# Patient Record
Sex: Male | Born: 1950 | Race: White | Hispanic: No | Smoking: Never smoker
Health system: Southern US, Community
[De-identification: ages and names within clinical notes are randomized; demographics above are authoritative.]

## PROBLEM LIST (undated history)

## (undated) DIAGNOSIS — I10 Essential (primary) hypertension: Secondary | ICD-10-CM

## (undated) DIAGNOSIS — E119 Type 2 diabetes mellitus without complications: Secondary | ICD-10-CM

## (undated) DIAGNOSIS — C61 Malignant neoplasm of prostate: Secondary | ICD-10-CM

## (undated) DIAGNOSIS — N200 Calculus of kidney: Secondary | ICD-10-CM

## (undated) DIAGNOSIS — K222 Esophageal obstruction: Secondary | ICD-10-CM

## (undated) DIAGNOSIS — Z8249 Family history of ischemic heart disease and other diseases of the circulatory system: Secondary | ICD-10-CM

## (undated) HISTORY — PX: PROSTATECTOMY: SHX69

## (undated) HISTORY — DX: Essential (primary) hypertension: I10

## (undated) HISTORY — DX: Calculus of kidney: N20.0

## (undated) HISTORY — DX: Malignant neoplasm of prostate: C61

## (undated) HISTORY — PX: TONSILLECTOMY: SUR1361

## (undated) HISTORY — PX: CATARACT EXTRACTION: SUR2

## (undated) HISTORY — DX: Family history of ischemic heart disease and other diseases of the circulatory system: Z82.49

## (undated) HISTORY — PX: VARICOCELECTOMY: SHX1084

---

## 2002-01-02 ENCOUNTER — Encounter: Payer: Self-pay | Admitting: Urology

## 2002-01-02 ENCOUNTER — Encounter: Admission: RE | Admit: 2002-01-02 | Discharge: 2002-01-02 | Payer: Self-pay | Admitting: Urology

## 2002-01-19 ENCOUNTER — Ambulatory Visit (HOSPITAL_COMMUNITY): Admission: RE | Admit: 2002-01-19 | Discharge: 2002-01-19 | Payer: Self-pay | Admitting: *Deleted

## 2002-02-12 ENCOUNTER — Encounter: Payer: Self-pay | Admitting: Urology

## 2002-02-19 ENCOUNTER — Inpatient Hospital Stay (HOSPITAL_COMMUNITY): Admission: RE | Admit: 2002-02-19 | Discharge: 2002-02-21 | Payer: Self-pay | Admitting: Urology

## 2002-02-19 ENCOUNTER — Encounter (INDEPENDENT_AMBULATORY_CARE_PROVIDER_SITE_OTHER): Payer: Self-pay | Admitting: Specialist

## 2002-10-15 ENCOUNTER — Ambulatory Visit: Admission: RE | Admit: 2002-10-15 | Discharge: 2003-01-06 | Payer: Self-pay | Admitting: Radiation Oncology

## 2003-01-18 ENCOUNTER — Ambulatory Visit: Admission: RE | Admit: 2003-01-18 | Discharge: 2003-01-18 | Payer: Self-pay | Admitting: Radiation Oncology

## 2003-02-11 ENCOUNTER — Ambulatory Visit: Admission: RE | Admit: 2003-02-11 | Discharge: 2003-04-05 | Payer: Self-pay | Admitting: Radiation Oncology

## 2007-07-03 ENCOUNTER — Encounter: Admission: RE | Admit: 2007-07-03 | Discharge: 2007-07-03 | Payer: Self-pay | Admitting: Internal Medicine

## 2007-07-22 ENCOUNTER — Encounter (INDEPENDENT_AMBULATORY_CARE_PROVIDER_SITE_OTHER): Payer: Self-pay | Admitting: *Deleted

## 2007-07-22 ENCOUNTER — Ambulatory Visit (HOSPITAL_COMMUNITY): Admission: RE | Admit: 2007-07-22 | Discharge: 2007-07-22 | Payer: Self-pay | Admitting: *Deleted

## 2010-04-19 ENCOUNTER — Ambulatory Visit (HOSPITAL_COMMUNITY)
Admission: RE | Admit: 2010-04-19 | Discharge: 2010-04-19 | Disposition: A | Discharge status: Home / Self Care | Payer: BC Managed Care – PPO | Source: Ambulatory Visit | Attending: Chiropractic Medicine | Admitting: Chiropractic Medicine

## 2010-04-19 ENCOUNTER — Other Ambulatory Visit (HOSPITAL_COMMUNITY): Payer: Self-pay | Admitting: Chiropractic Medicine

## 2010-04-19 DIAGNOSIS — R52 Pain, unspecified: Secondary | ICD-10-CM

## 2010-04-19 DIAGNOSIS — M47812 Spondylosis without myelopathy or radiculopathy, cervical region: Secondary | ICD-10-CM | POA: Insufficient documentation

## 2010-06-13 NOTE — Op Note (Signed)
NAMECACE, OSORTO NO.:  [id number]   MEDICAL RECORD NO.:  [id number]          PATIENT TYPE:  OUT   LOCATION:  ENDO                         FACILITY:  [facility]   PHYSICIAN:  Georgiana Spinner, M.D.    DATE OF BIRTH:  07-15-1950   DATE OF PROCEDURE:  DATE OF DISCHARGE:                               OPERATIVE REPORT   PROCEDURE:  Colonoscopy.   INDICATIONS:  Rectal bleeding, colon polyps.   ANESTHESIA:  Fentanyl 25 mcg, Versed 2 mg.   PROCEDURE IN DETAIL:  With the patient mildly sedated in the left  lateral decubitus position, the Pentax videoscopic colonoscope was  inserted in the rectum and passed under direct vision to the cecum.  The  cecum was identified by crow's foot of the cecum, ileocecal valve, both  of which were photographed.  From this point the colonoscope was slowly  withdrawn.  Taking circumferential views, the colonic mucosa stopping in  the descending colon, where a polyp was seen, photographed and removed  using snare cautery technique at a setting of 20/150 blended current.  The endoscope was then withdrawn all the way to the rectum, which  appeared normal and showed hemorrhoids in retroflexed view.  The  endoscope was straightened and withdrawn.  The patient's vital signs and  pulse oximeter remained stable.  The patient tolerated the procedure  well. without apparent complications.   FINDINGS:  Polyp of descending colon, internal hemorrhoids, otherwise  unremarkable exam.   PLAN:  Await biopsy report.  The patient will call me for results and  followup with me as needed as an outpatient.           ______________________________  Georgiana Spinner, M.D.     GMO/MEDQ  D:  07/22/2007  T:  07/22/2007  Job:  [id number]

## 2010-06-13 NOTE — Op Note (Signed)
James Booth, SALAHUDDIN NO.:  [id number]   MEDICAL RECORD NO.:  [id number]          PATIENT TYPE:  OUT   LOCATION:  ENDO                         FACILITY:  [facility]   PHYSICIAN:  Georgiana Spinner, M.D.    DATE OF BIRTH:  May 22, 1950   DATE OF PROCEDURE:  07/22/2007  DATE OF DISCHARGE:                               OPERATIVE REPORT   PROCEDURE:  Upper endoscopy.   INDICATIONS:  Abdominal pain.   ANESTHESIA:  1. Fentanyl 100 mcg.  2. Versed 6 mg.   PROCEDURE:  With the patient mildly sedated in the left lateral  decubitus position, the Pentax videoscopic endoscope was inserted in the  mouth and passed under direct vision through the esophagus, which  appeared normal, into the stomach.  Fundus, body, antrum, duodenal bulb,  and second portion of the duodenum all appeared normal.  From this  point, the endoscope was slowly withdrawn, taking circumferential views  of the duodenal mucosa until the endoscope had been pulled back into the  stomach and placed in retroflexion to view the stomach from below.  The  endoscope was straightened and withdrawn, taking circumferential views  of the remaining gastric and esophageal mucosa.  The patient's vital  signs and  pulse oximeter remained stable.  The patient tolerated  procedure well, without apparent complications.   FINDINGS:  Negative exam.   PLAN:  Proceed to colonoscopy.           ______________________________  Georgiana Spinner, M.D.     GMO/MEDQ  D:  07/22/2007  T:  07/22/2007  Job:  [id number]

## 2010-06-16 NOTE — Op Note (Signed)
   NAME:  James Booth, James Booth                           ACCOUNT NO.:  [id number]   MEDICAL RECORD NO.:  [id number]                   PATIENT TYPE:  AMB   LOCATION:  ENDO                                 FACILITY:  [facility]   PHYSICIAN:  Georgiana Spinner, M.D.                 DATE OF BIRTH:  03/26/1950   DATE OF PROCEDURE:  DATE OF DISCHARGE:                                 OPERATIVE REPORT   PROCEDURE:  Colonoscopy.   INDICATIONS FOR PROCEDURE:  Rectal bleeding, colon cancer screening.   ANESTHESIA:  Demerol 10, Versed 2 mg.   DESCRIPTION OF PROCEDURE:  With the patient mildly sedated in the left  lateral decubitus position, the Olympus videoscopic colonoscope was inserted  in the rectum after a normal rectal exam and passed under direct vision to  the cecum. The cecum identified by the ileocecal valve and appendiceal  orifice both of which were photographed. From this point, the colonoscope  was slowly withdrawn taking circumferential views of the entire colonic  mucosa stopping only in the rectum which appeared normal in the rectum and  on retroflexed view. The endoscope was straightened and withdrawn. The  patient's vital signs and pulse oximeter remained stable. The patient  tolerated the procedure well without apparent complications.   FINDINGS:  Unremarkable colonoscopic examination to the cecum.   PLAN:  Have the patient followup with me as needed.                                                Georgiana Spinner, M.D.    GMO/MEDQ  D:  01/19/2002  T:  01/19/2002  Job:  [id number]

## 2010-06-16 NOTE — Op Note (Signed)
NAME:  James Booth, James Booth                           ACCOUNT NO.:  [id number]   MEDICAL RECORD NO.:  [id number]                   PATIENT TYPE:  INP   LOCATION:  X003                                 FACILITY:  [facility]   PHYSICIAN:  Excell Seltzer. Annabell Howells, M.D.                 DATE OF BIRTH:  01-07-1951   DATE OF PROCEDURE:  02/19/2002  DATE OF DISCHARGE:                                 OPERATIVE REPORT   PREOPERATIVE DIAGNOSIS:  Adenocarcinoma of the prostate.   POSTOPERATIVE DIAGNOSIS:  Adenocarcinoma of the prostate.   PROCEDURES:  1. Bilateral pelvic lymphadenectomy.  2. Radical retropubic prostatectomy.   SURGEON:  Excell Seltzer. Annabell Howells, M.D.   ASSISTANT:  Crecencio Mc, M.D.   ANESTHESIA:  General.   ESTIMATED BLOOD LOSS:  750 cubic centimeters.   INTRAVENOUS FLUIDS:  3600 cubic centimeters of crystalloid.   INDICATION:  Mr. James Booth is a 60 year old male, who was recently seen in the  urology clinic due to an elevated PSA of 18.1.  The patient was noted to  have some pyuria on his expressed prostatic secretion and therefore was  treated with doxycycline for prostatitis with plans to repeat his PSA after  treatment.  His subsequent PSA returned elevated at 23.5.  After discussing  options, it was therefore decided to proceed with prostate biopsy.  He  underwent a prostate biopsy which demonstrated a Gleason 3 + 3 = 6  adenocarcinoma of the prostate in approximately 5% of his right-sided  biopsies.  The patient underwent a metastatic evaluation with a bone scan  and CT scan as well due to his high PSA level, and these returned negative  for disease.  After discussing options with the patient, he elected to  proceed with surgical therapy.  Potential risks and benefits of a radical  retropubic prostatectomy were discussed with the patient, and he consented.   DESCRIPTION OF PROCEDURE:  The patient was taken to the operating room, and  a general anesthetic was administered.  The patient was placed in  a supine  position and prepped and draped in the usual sterile fashion.  Preoperative  antibiotics were administered.  Next, a 20 [place] Foley catheter was  inserted into the bladder, and the bladder was drained.  A lower midline  infraumbilical incision was then made from just below the umbilicus down to  the pubic symphysis.  This was carried down through the subcutaneous tissues  with a #10 blade.  Electrocautery was then used to control any bleeding, and  the fascia was identified.  The rectus fascia was then divided, and the  rectus muscles were separated.  The transversalis was sharply entered, and  the space of Retzius was bluntly developed.  Attention then turned to pelvic  lymphadenectomy.  A self-retaining Bookwalter retractor was placed, and the  right-sided lymphadenectomy ensued.  The obturator lymph node package was  carefully removed with sharp  and blunt dissection as necessary.  Care was  taken to hemoclip any lymphatics or blood vessels.  There were no palpable  abnormalities in this lymph node package.  It was then removed and sent off  for permanent pathologic specimen.  Attention then turned to the left side,  and a lymphadenectomy was performed in a similar fashion.  Attention then  turned to the prostatectomy.  The endopelvic fascia on either side of the  prostate was sharply entered, and the levator fibers were bluntly dissected  free from the prostate.  The endopelvic fascia was opened both anteriorly  and posteriorly.  A Hohenfellner was then placed in the space between the  dorsal vein complex and just anterior to the urethra.  A #1 Vicryl was then  passed through the space, and the dorsal vein complex was tied off.  The  venous structures overlying the anterior prostate were then grasped with an  Allis, and a 2-0 Vicryl backbleeding stitch was placed at this point.  Electrocautery was then used to take down the dorsal vein complex.  This was  accomplished without  significant bleeding.  The anterior urethra was then  palpated.  The neurovascular bundle were then displaced laterally and  posteriorly away from the urethra on either side with a right-angle.  The  anterior urethra was then entered sharply with Metzenbaum scissors, and the  Foley catheter was lifted up, cut distally, and brought into the wound.  A  right-angle was passed beneath the posterior urethra, and a piece of  umbilical tape was used to encircle the posterior urethra.  It was then  sharply divided with Metzenbaum scissors as well.  The space between the  anterior rectal fascia and Denonvillier's fascia was then bluntly developed  using digital dissection.  Attention then turned to the lateral prostatic  pedicles which were taken in a piecemeal fashion with right-angle dissection  and hemoclips.  Once the prostatic pedicles were taken down, Denonvillier's  fascia was lightly scored with the electrocautery.  This fascial layer was  then dissected away from the seminal vesicals and the ampulla of the vas  deferens.  Each of these structures were individually dissected free with a  right-angle, hemoclipped, and cut with Metzenbaum scissors.  Attention then  turned to the bladder neck.  The space between the space of the prostate and  bladder neck was identified and carefully dissected out with the aid of a  tonsil.  Once the bladder neck was skeletonized, it was sharply entered with  Metzenbaum scissors.  The Foley balloon was then deflated, and the distal  end of the catheter was removed from the bladder and brought out into the  wound.  The posterior bladder neck was then identified and sharply divided.  This left only a few posterior attachments to the prostate which were  clipped and then divided.  The prostate and seminal vesicals were then  passed off the table for permanent pathologic specimen.  Any bleeding sites  were identified and controlled with hemoclips or electrocautery.   Once sufficient hemostasis was achieved, attention turned to reconstruction of  the bladder neck.  Interrupted 4-0 chromic sutures were used to evert the  bladder neck mucosa.  Then 2-0 Vicryl anastomotic stitches were then placed  into the bladder neck at the 12, 2, 5, 7, and 10 o'clock positions in the  bladder neck and brought out through their corresponding position in the  urethral stump.  A new 20 French Foley catheter was  placed just prior to the  anastomotic stitches being placed.  A Prolene suture was then tied to the  distal Foley catheter and brought out through a separate site in the  anterior bladder.  Foley catheter was then inserted into the bladder, and  the balloon was inflated with 15 cubic centimeters of sterile water.  The  anastomotic stitches were then sequentially tied down, and the Foley  catheter was irrigated.  There appeared to be no anastomotic leak, and the  catheter irrigated without difficulty.  The aforementioned Prolene suture  was then brought out through a separate site in the right-sided anterior  abdominal wall and secured to the skin with the aid of a button.  A #10  Blake drain was also placed into the pelvis and brought out through a  separate stab incision in the left-sided abdominal wall.  This was secured  to the skin with a silk suture.  The fascia was then brought together with a  running #1 Prolene suture.  The wound was then irrigated, and the skin was  reapproximated with staples.  A dressing was placed, and the procedure was  ended.  The patient appeared to tolerate the procedure well and without  complications.  He was able to be transferred to the recovery unit in  satisfactory condition, after being extubated.  Please note that Excell Seltzer.  Annabell Howells, M.D. was the operating surgeon and was present and participated in  the entire procedure.     Crecencio Mc, M.D.                          Excell Seltzer. Annabell Howells, M.D.    LB/MEDQ  D:  02/19/2002  T:   02/19/2002  Job:  [id number]   cc:   Excell Seltzer. Annabell Howells, M.D.  [address]  [place]  [place] [address]  Fax: [phone]   Soyla Murphy. Renne Crigler, M.D.  [address]  [place]  [place] [address]  Fax: [phone]

## 2010-06-16 NOTE — Op Note (Signed)
   NAME:  James Booth, James Booth                           ACCOUNT NO.:  [id number]   MEDICAL RECORD NO.:  [id number]                   PATIENT TYPE:  AMB   LOCATION:  ENDO                                 FACILITY:  [facility]   PHYSICIAN:  Georgiana Spinner, M.D.                 DATE OF BIRTH:  1950/04/30   DATE OF PROCEDURE:  DATE OF DISCHARGE:                                 OPERATIVE REPORT   PROCEDURE:  Upper endoscopy.   INDICATIONS FOR PROCEDURE:  Gastroesophageal reflux disease.   ANESTHESIA:  Demerol 70, Versed 7 mg.   DESCRIPTION OF PROCEDURE:  With the patient mildly sedated in the left  lateral decubitus position, the Olympus videoscopic endoscope was inserted  in the mouth and passed under direct vision through the esophagus which  appeared normal. There was no evidence of Barrett's. We entered into the  stomach. The fundus, body, antrum, duodenal bulb and second portion of the  duodenum all were visualized. From this point, the endoscope was slowly  withdrawn taking circumferential views of the entire duodenal mucosa until  the endoscope was then pulled back in the stomach, placed in retroflexion to  view the stomach from below. The endoscope was then straightened and  withdrawn taking circumferential views of the remaining gastric and  esophageal mucosa. The patient's vital signs and pulse oximeter remained  stable. The patient tolerated the procedure well without apparent  complications.   FINDINGS:  Question of a loose wrap of the GE junction around the endoscope,  otherwise, an unremarkable examination.   PLAN:  Proceed to colonoscopy.                                                Georgiana Spinner, M.D.    GMO/MEDQ  D:  01/19/2002  T:  01/19/2002  Job:  [id number]

## 2010-10-17 ENCOUNTER — Other Ambulatory Visit: Payer: Self-pay | Admitting: Gastroenterology

## 2011-12-10 ENCOUNTER — Other Ambulatory Visit: Payer: Self-pay | Admitting: Dermatology

## 2012-06-09 ENCOUNTER — Other Ambulatory Visit: Payer: Self-pay | Admitting: Dermatology

## 2012-06-11 ENCOUNTER — Ambulatory Visit: Payer: BC Managed Care – PPO | Admitting: Sports Medicine

## 2012-10-16 ENCOUNTER — Encounter: Payer: Self-pay | Admitting: Sports Medicine

## 2012-10-16 ENCOUNTER — Ambulatory Visit (INDEPENDENT_AMBULATORY_CARE_PROVIDER_SITE_OTHER): Payer: BC Managed Care – PPO | Admitting: Sports Medicine

## 2012-10-16 VITALS — BP 178/97 | HR 69 | Ht 69.0 in | Wt 161.0 lb

## 2012-10-16 DIAGNOSIS — M7500 Adhesive capsulitis of unspecified shoulder: Secondary | ICD-10-CM

## 2012-10-16 DIAGNOSIS — M7502 Adhesive capsulitis of left shoulder: Secondary | ICD-10-CM | POA: Insufficient documentation

## 2012-10-16 DIAGNOSIS — M25519 Pain in unspecified shoulder: Secondary | ICD-10-CM

## 2012-10-16 DIAGNOSIS — M25512 Pain in left shoulder: Secondary | ICD-10-CM

## 2012-10-16 MED ORDER — METHYLPREDNISOLONE ACETATE 40 MG/ML IJ SUSP
40.0000 mg | Freq: Once | INTRAMUSCULAR | Status: AC
  Administered 2012-10-16: 40 mg via INTRA_ARTICULAR

## 2012-10-16 MED ORDER — AMITRIPTYLINE HCL 25 MG PO TABS: 25.0000 mg | ORAL_TABLET | Freq: Every day | ORAL | Status: DC

## 2012-10-16 NOTE — Assessment & Plan Note (Signed)
We will try a periodic high volume injection approach  Work dialy on Codman exercises  Reck 1 month to reassess motion and at that visit consider [place]

## 2012-10-16 NOTE — Patient Instructions (Addendum)
We are going to have you work on your range of motion for your shoulder to help prevent you from getting stiff. Follow the exercise sheet and do this 1-2 times a day.   For the next few days, you're going to be sore in the shoulder where you got the injection. Ice it 2-3 times a day for 15-20 minutes. You can take advil or aleve as needed.  You are going to take a muscle relaxer at night called amitriptyline.   We will bring you back in a month and see how you're doing.    Adhesive Capsulitis Sometimes the shoulder becomes stiff and is painful to move. Some people say it feels as if the shoulder is frozen in place. Because of this, the condition is called "frozen shoulder." Its medical name is adhesive capsulitis.  The shoulder joint is made up of strong connective tissue that attaches the ball of the humerus to the shallow shoulder socket. This strong connective tissue is called the joint capsule. This tissue can become stiff and swollen. That is when adhesive capsulitis sets in. CAUSES  It is not always clear just what the cause adhesive capsulitis. Possibilities include:  Injury to the shoulder joint.  Strain. This is a repetitive injury brought about by overuse.  Lack of use. Perhaps your arm or hand was otherwise injured. It might have been in a sling for awhile. Or perhaps you were not using it to avoid pain.  Referred pain. This is a sort of trick the body plays. You feel pain in the shoulder. But, the pain actually comes from an injury somewhere else in the body.  Long-standing health problems. Several diseases can cause adhesive capsulitis. They include diabetes, heart disease, stroke, thyroid problems, rheumatoid arthritis and lung disease.  Being a women older than 40. Anyone can develop adhesive capsulitis but it is most common in women in this age group. SYMPTOMS   Pain.  It occurs when the arm is moved.  Parts of the shoulder might hurt if they are touched.  Pain is  worse at night or when resting.  Soreness. It might not be strong enough to be called pain. But, the shoulder aches.  The shoulder does not move freely.  Muscle spasms.  Trouble sleeping because of shoulder ache or pain. DIAGNOSIS  To decide if you have adhesive capsulitis, your healthcare provider will probably:  Ask about symptoms you have noticed.  Ask about your history of joint pain and anything that might have caused the pain.  Ask about your overall health.  Use hands to feel your shoulder and neck.  Ask you to move your shoulder in specific directions. This may indicate the origin of the pain.  Order imaging tests; pictures of the shoulder. They help pinpoint the source of the problem. An X-ray might be used. For more detail, an MRI is often used. An MRI details the tendons, muscles and ligaments as well as the joint. TREATMENT  Adhesive capsulitis can be treated several ways. Most treatments can be done in a clinic or in your healthcare provider's office. Be sure to discuss the different options with your caregiver. They include:  Physical therapy. You will work on specific exercises to get your shoulder moving again. The exercises usually involve stretching. A physical therapist (a caregiver with special training) can show you what to do and what not to do. The exercises will need to be done daily.  Medication.  Over-the-counter medicines may relieve pain and inflammation (the  body's way of reacting to injury or infection).  Corticosteroids. These are stronger drugs to reduce pain and inflammation. They are given by injection (shots) into the shoulder joint. Frequent treatment is not recommended.  Muscle relaxants. Medication may be prescribed to ease muscle spasms.  Treatment of underlying conditions. This means treating another condition that is causing your shoulder problem. This might be a rotator cuff (tendon) problem  Shoulder manipulation. The shoulder will be  moved by your healthcare provider. You would be under general anesthesia (given a drug that puts you to sleep). You would not feel anything. Sometimes the joint will be injected with salt water (saline) at high pressure to break down internal scarring in the joint capsule.  Surgery. This is rarely needed. It may be suggested in advanced cases after all other treatment has failed. PROGNOSIS  In time, most people recover from adhesive capsulitis. Sometimes, however, the pain goes away but full movement of the shoulder does not return.  HOME CARE INSTRUCTIONS   Take any pain medications recommended by your healthcare provider. Follow the directions carefully.  If you have physical therapy, follow through with the therapist's suggestions. Be sure you understand the exercises you will be doing. You should understand:  How often the exercises should be done.  How many times each exercise should be repeated.  How long they should be done.  What other activities you should do, or not do.  That you should warm up before doing any exercise. Just 5 to 10 minutes will help. Small, gentle movements should get your shoulder ready for more.  Avoid high-demand exercise that involves your shoulder such as throwing. This type of exercise can make pain worse.  Consider using cold packs. Cold may ease swelling and pain. Ask your healthcare provider if a cold pack might help you. If so, get directions on how and when to use them. SEEK MEDICAL CARE IF:   You have any questions about your medications.  Your pain continues to increase. Document Released: 11/12/2008 Document Revised: 04/09/2011 Document Reviewed: 11/12/2008 [facility] Patient Information 2014 [place], [place].

## 2012-10-16 NOTE — Progress Notes (Signed)
  Subjective:    Patient ID: James Booth, male    DOB: 1950-08-15, 62 y.o.   MRN: [id number]  HPI  Patient referred courtesy Dr Renne Crigler  James Booth is a 62 year old male with a history of diet-controlled T2DM who presents with left shoulder pain since June. James Booth states that turning and moving his arm certain ways, especially reaching out to the side and behind his back sends a lightening like sharp pain down his left arm. James Booth feels numb in his arm and has not had much improvement. James Booth does at times have posterior shoulder pain and this is a sharp pain that is a 10/10 at the time it starts, but with rest, James Booth has only a small dull pain. James Booth does feel slightly weak in his arm and feels like James Booth may have lost some fine motor control. James Booth sometimes feels a pulling sensation in his neck if James Booth turns his head to the right.   No specific injury triggered this. No Hx of cervical disk problems No similar problems in RT shoulder  Dm has been controlled by diet so that James Booth has had a HGB A1C < 6  Review of Systems     Objective:   Physical Exam  Constitutional: James Booth is oriented to person, place, and time. James Booth appears well-developed and well-nourished.  HENT:  Head: Normocephalic and atraumatic.  Eyes: Conjunctivae are normal.  Pulmonary/Chest: Effort normal.  Neurological: James Booth is alert and oriented to person, place, and time.  Psychiatric: James Booth has a normal mood and affect. His behavior is normal.    Left shoulder exam:  Normal to inspection, no deformity or scapular winging No tenderness to palpation over scapular spine, at [facility] joint, along clavicle, or at proximal biceps tendon insertion.  Forward flexion to 165 degrees vs 180 RT, abduction/elevation to 110 degrees vs 170 RT,  external rotation at waist level was 50 degrees RT and 25 deg left,   Internal rotation behind back to L4, right side gets to T6. Strength 5/5 with FF, abd, ER/IR  + Neers, + Hawkins, + liftoff all with mild pain but primarily from  position  negative O'Briens, negative empty can, negative speed's, negative Yergasons, negative Sperlings  Negative radial/ulnar tinel sign at elbow     Assessment & Plan:  62 year old male with diet controlled type 2 diabetes who presents with 3 months of shoulder pain and limited ROM, likely 2/2 to adhesive capsulitis  - Inject GH joint - Work on ROM exercises at home - F/U in 1 month to scan shoulder - Amitriptyline 25 mg qHS  Procedure:  Injection of left glenohumeral joint Consent obtained and verified. Time-out conducted. Noted no overlying erythema, induration, or other signs of local infection. Skin prepped in a sterile fashion. Topical analgesic spray: Ethyl chloride. Completed without difficulty. Meds: 7 cc 1% lidocaine with 1 cc depomedrol (40 mg) Pain immediately improved suggesting accurate placement of the medication. Advised to call if fevers/chills, erythema, induration, drainage, or persistent bleeding.

## 2012-11-20 ENCOUNTER — Ambulatory Visit: Payer: BC Managed Care – PPO | Admitting: Sports Medicine

## 2012-11-27 ENCOUNTER — Ambulatory Visit (INDEPENDENT_AMBULATORY_CARE_PROVIDER_SITE_OTHER): Payer: BC Managed Care – PPO | Admitting: Sports Medicine

## 2012-11-27 ENCOUNTER — Encounter: Payer: Self-pay | Admitting: Sports Medicine

## 2012-11-27 VITALS — BP 160/93 | Ht 68.0 in | Wt 165.0 lb

## 2012-11-27 DIAGNOSIS — M7500 Adhesive capsulitis of unspecified shoulder: Secondary | ICD-10-CM

## 2012-11-27 DIAGNOSIS — M7502 Adhesive capsulitis of left shoulder: Secondary | ICD-10-CM

## 2012-11-27 MED ORDER — AMITRIPTYLINE HCL 10 MG PO TABS: 10.0000 mg | ORAL_TABLET | Freq: Every day | ORAL | Status: DC

## 2012-11-27 NOTE — Progress Notes (Signed)
Patient ID: James Booth, male   DOB: 05/10/1950, 62 y.o.   MRN: [id number] 62 year old male presents for followup the left adhesive capsulitis. Was given a corticosteroid injection in his last visit which did not provide much relief. Not any worse however not much better. Has been doing home exercises including pendulum swing, internal and external rotation stretching and stretches overhead. Still unable to compare her to the back reassured him. Does not feel his symptoms have worsened much, however, if not improved.  Review of systems: As per history of present illness otherwise negative.  Examination: BP 160/93  Ht 5\' 8"  (1.727 m)  Wt 165 lb (74.844 kg)  BMI 25.09 kg/m2 Well-developed well-nourished 62 year old white male awake alert and oriented in no acute distress  Left shoulder: Abduction and forward flexion to 170 External rotation to 30 (this is markedly less than the right side) Internal rotation the posterior waist Strength 5 over 5 to flexion abduction Positive Hawkin's, empty can and pushoff test. Neurovascularly intact bilateral upper extremity is with equal pulses  Musculoskeletal ultrasound of the left shoulder was done in the office. Images obtained included transverse and longitudinal views of the biceps tendon, subscapularis tendon, supraspinatus tendon and infraspinatus tendons. In addition, the glenohumeral joint and teres minor muscles were visualized. 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  There was no evidence of rotator cuff tendon tear.

## 2012-11-27 NOTE — Patient Instructions (Signed)
Continue to do the range of motion exercises at home.  Take the elavil at night.  Follow up in 6 weeks time.

## 2012-11-27 NOTE — Assessment & Plan Note (Signed)
No significant worsening in range of motion however, not much improvement. Patient started on 10 mg of amitriptyline each bedtime Will continue pendulum swings and other home stretching program. Followup in 6 weeks' time.

## 2012-12-04 ENCOUNTER — Other Ambulatory Visit: Payer: Self-pay

## 2013-01-08 ENCOUNTER — Encounter: Payer: Self-pay | Admitting: Sports Medicine

## 2013-01-08 ENCOUNTER — Ambulatory Visit (INDEPENDENT_AMBULATORY_CARE_PROVIDER_SITE_OTHER): Payer: BC Managed Care – PPO | Admitting: Sports Medicine

## 2013-01-08 VITALS — BP 158/101 | HR 78 | Ht 68.0 in | Wt 165.0 lb

## 2013-01-08 DIAGNOSIS — M7502 Adhesive capsulitis of left shoulder: Secondary | ICD-10-CM

## 2013-01-08 DIAGNOSIS — M7500 Adhesive capsulitis of unspecified shoulder: Secondary | ICD-10-CM

## 2013-01-08 DIAGNOSIS — E1110 Type 2 diabetes mellitus with ketoacidosis without coma: Secondary | ICD-10-CM

## 2013-01-08 MED ORDER — AMITRIPTYLINE HCL 25 MG PO TABS: 25.0000 mg | ORAL_TABLET | Freq: Every day | ORAL | Status: DC

## 2013-01-08 NOTE — Assessment & Plan Note (Signed)
He is moving from painful phase to frozen phase of adhesive capsulitis  Much less pain but still stiffness is similar  What I do note today is some progress in ROM particularly with elevation and abduction  Offered formal PT but since in a busy time at work wants to keep working on HEP  Increase amitriptyline to 25 at HS Add Vit C 500 and Vit D 800 IU - both have some limited EBM for benefit in this condition  Reck 3 mos/ reassured that he is on track of typical case

## 2013-01-08 NOTE — Patient Instructions (Signed)
Please increase amitriptyline to 25 mg at bedtime  Start Vitamin C 500 mg, Vitamin D 800 IU per day   Add doorway stretch to exercise regimen  When doing exercises ok to push to mild pain, but not more than that  Please follow up in 3 months  Thank you for seeing [place] today!

## 2013-01-08 NOTE — Progress Notes (Signed)
   Subjective:    Patient ID: James Booth, male    DOB: 1950/02/02, 62 y.o.   MRN: [id number]  HPI  Pt presents to clinic for f/u of lt adhesive capsulitis which he reports is the same.  We first saw him in Sept Has aching in Lt shoulder at rest some days, certain motions - overhead, elevation and abduction, and reaching backward cause pain. Compliant with home exercises. Taking 10 mg of amitriptyline at bedtime with no side effects.   Note [facility] only gave him a couple of weeks of relief on visit 1  What he does note is that he is able to do more activity and does not get pain that he did note before He no longer awakes with night pain  Tightness and exercises are still hard  Soc Hx:  In middle of merger at [employer] and hard to get free to do formal PT   Review of Systems     Objective:   Physical Exam Pleasant, NAD  Lt shoulder exam:  180 deg flexion  85 deg abduction 170 palm up abduction and elevation ER 30 deg Positive frozen shoulder test with 10 deg motion with assist Back scratch to belt line   Strength is good  He is able to demonstrate all exercises        Assessment & Plan:

## 2013-01-08 NOTE — Assessment & Plan Note (Signed)
He is to see Dr Renne Crigler soon  If DM control an issue he will work on that but he thinks BS have been OK

## 2013-01-25 ENCOUNTER — Other Ambulatory Visit: Payer: Self-pay | Admitting: Emergency Medicine

## 2013-02-26 ENCOUNTER — Ambulatory Visit: Payer: BC Managed Care – PPO | Admitting: Sports Medicine

## 2013-07-24 ENCOUNTER — Ambulatory Visit (INDEPENDENT_AMBULATORY_CARE_PROVIDER_SITE_OTHER): Payer: BC Managed Care – PPO | Admitting: Cardiovascular Disease

## 2013-07-24 ENCOUNTER — Encounter: Payer: Self-pay | Admitting: Cardiovascular Disease

## 2013-07-24 VITALS — BP 184/86 | HR 88 | Ht 69.0 in | Wt 178.4 lb

## 2013-07-24 DIAGNOSIS — I10 Essential (primary) hypertension: Secondary | ICD-10-CM

## 2013-07-24 DIAGNOSIS — Z79899 Other long term (current) drug therapy: Secondary | ICD-10-CM

## 2013-07-24 MED ORDER — LISINOPRIL 5 MG PO TABS: 5.0000 mg | ORAL_TABLET | Freq: Every day | ORAL | Status: DC

## 2013-07-24 NOTE — Progress Notes (Signed)
07/24/2013 NAWAF STRANGE   [birth year]  [id number]  Primary Physician Horatio Pel, MD Primary Cardiologist: Lorretta Harp MD Renae Gloss   HPI:  Mr. Tuite is a delightful 63 year old divorced Caucasian male father of 62, grandfather for grandchildren lives with his long-time girlfriend. He works in Engineer, technical sales at [employer]  I saw him 5 years ago because of chest pain. His risk factors include family history with a mother who died at age 102 of myocardial infarction but otherwise is negative other than recently diagnosed type 2 diabetes and hypertension. He was never had a heart attack or stroke. At that time addendum a Myoview stress test was normal and his pain resolved. He does have a remote history of prostate cancer status post prostatectomy in 2004 by Dr. Irine Seal. He saw Dr. Deland Pretty recently for primary care who noted some PVCs and referred him back for further evaluation. He denies chest pain or shortness of breath.   Current Outpatient Prescriptions  Medication Sig Dispense Refill  . DYMISTA 137-50 MCG/ACT SUSP Place 1 spray into the nose as needed.      Marland Kitchen lisinopril (PRINIVIL,ZESTRIL) 5 MG tablet Take 1 tablet (5 mg total) by mouth daily.  90 tablet  3  . pantoprazole (PROTONIX) 40 MG tablet Take 1 tablet by mouth daily.       No current facility-administered medications for this visit.    Allergies  Allergen Reactions  . Aspirin Nausea Only    Acid reflux    History   Social History  . Marital Status: Divorced    Spouse Name: N/A    Number of Children: N/A  . Years of Education: N/A   Occupational History  . Not on file.   Social History Main Topics  . Smoking status: Never Smoker   . Smokeless tobacco: Never Used  . Alcohol Use: Not on file  . Drug Use: Not on file  . Sexual Activity: Not on file   Other Topics Concern  . Not on file   Social History Narrative  . No narrative on file     Review of Systems: General: negative for  chills, fever, night sweats or weight changes.  Cardiovascular: negative for chest pain, dyspnea on exertion, edema, orthopnea, palpitations, paroxysmal nocturnal dyspnea or shortness of breath Dermatological: negative for rash Respiratory: negative for cough or wheezing Urologic: negative for hematuria Abdominal: negative for nausea, vomiting, diarrhea, bright red blood per rectum, melena, or hematemesis Neurologic: negative for visual changes, syncope, or dizziness All other systems reviewed and are otherwise negative except as noted above.    Blood pressure 184/86, pulse 88, height 5\' 9"  (1.753 m), weight 178 lb 6.4 oz (80.922 kg).  General appearance: alert and no distress Neck: no adenopathy, no carotid bruit, no JVD, supple, symmetrical, trachea midline and thyroid not enlarged, symmetric, no tenderness/mass/nodules Lungs: clear to auscultation bilaterally Heart: regular rate and rhythm, S1, S2 normal, no murmur, click, rub or gallop Extremities: extremities normal, atraumatic, no cyanosis or edema  EKG normal sinus rhythm at 88 without ST or T wave changes  ASSESSMENT AND PLAN:   Essential hypertension His blood pressure has been consistently elevated recently. Also was recently diagnosed with type 2 diabetes. I'm going to start him on low-dose ACE inhibitor, have him keep a blood pressure log for the next month until basic metabolic panel in 2 weeks. He will see Erasmo Downer back to review this data and to determine if any adjustments need to be  made. I will see him back when necessary      Lorretta Harp MD [facility], [facility] 07/24/2013 4:56 PM

## 2013-07-24 NOTE — Assessment & Plan Note (Signed)
His blood pressure has been consistently elevated recently. Also was recently diagnosed with type 2 diabetes. I'm going to start him on low-dose ACE inhibitor, have him keep a blood pressure log for the next month until basic metabolic panel in 2 weeks. He will see Erasmo Downer back to review this data and to determine if any adjustments need to be made. I will see him back when necessary

## 2013-07-24 NOTE — Patient Instructions (Signed)
  We will see you back in follow up in 1 month with [facility] for blood pressure management.   Dr Gwenlyn Found has ordered : 1. Start Lisinopril 5mg  daily  2. Have blood work done in 3 weeks.

## 2013-08-25 LAB — BASIC METABOLIC PANEL
BUN: 15 mg/dL (ref 6–23)
CHLORIDE: 104 meq/L (ref 96–112)
CO2: 28 meq/L (ref 19–32)
CREATININE: 0.79 mg/dL (ref 0.50–1.35)
Calcium: 9.2 mg/dL (ref 8.4–10.5)
GLUCOSE: 165 mg/dL — AB (ref 70–99)
Potassium: 4.4 mEq/L (ref 3.5–5.3)
Sodium: 142 mEq/L (ref 135–145)

## 2013-08-26 ENCOUNTER — Encounter: Payer: Self-pay | Admitting: Pharmacist Clinician (PhC)/ Clinical Pharmacy Specialist

## 2013-08-26 ENCOUNTER — Ambulatory Visit (INDEPENDENT_AMBULATORY_CARE_PROVIDER_SITE_OTHER): Payer: BC Managed Care – PPO | Admitting: Pharmacist Clinician (PhC)/ Clinical Pharmacy Specialist

## 2013-08-26 VITALS — BP 156/92 | HR 80 | Ht 69.0 in | Wt 177.0 lb

## 2013-08-26 DIAGNOSIS — I10 Essential (primary) hypertension: Secondary | ICD-10-CM

## 2013-08-26 NOTE — Progress Notes (Signed)
     08/26/2013 James Booth March 18, 1950 [id number]   HPI:  James Booth is a 63 y.o. male patient of Dr Gwenlyn Found, with a PMH below who presents today for a blood pressure check.  Other than his mother, who died at age 75 of myocardial infarction, he has no significant CV risk factors.  In the past year he has been diagnosed with both DM and hypertension.  He is taking only lisinopril 5mg  daily, as they are trying to manage the DM with diet and exercise at this time.   His last A1c was 6.1 and most fasting home BS readings are 100-120.  He states he has previously had problems with his BP going "up and down" and was told there was probably some white coat hypertension.  He wore a 24hr BP monitor several years ago, but states that the average readings was still WNL.  He complains of occasional "fogginess" in his head, but otherwise no problems since starting the lisinopril one month ago.  He does not use tobacco, averages 1 alcoholic drink per day, and drinks 3-4 cups of coffee per day.  Occasionally will get a coffee at [facility] or similar coffee shop.  He still works, has a Network engineer job, but tries to walk 15 minutes twice daily and does some yard work.   He has used salt at the table, but is trying to cut back and uses it only on certain foods.  He brings in his home readings today, which show a marked improvement once he started the lisinopril.  His highest reading since then has been 140/90, with averags in the 086P systolic and 61P diastolic.  He also has his cuff today for comparison, an [employer] wrist model that is fairly new.   Current Outpatient Prescriptions  Medication Sig Dispense Refill  . DYMISTA 137-50 MCG/ACT SUSP Place 1 spray into the nose as needed.      Marland Kitchen lisinopril (PRINIVIL,ZESTRIL) 5 MG tablet Take 1 tablet (5 mg total) by mouth daily.  90 tablet  3  . pantoprazole (PROTONIX) 40 MG tablet Take 1 tablet by mouth daily as needed.        No current facility-administered medications for this  visit.    Allergies  Allergen Reactions  . Aspirin Nausea Only    Acid reflux    Past Medical History  Diagnosis Date  . Hypertension   . Family history of heart disease     Blood pressure 156/92, pulse 80, height 5\' 9"  (1.753 m), weight 177 lb (80.287 kg).Standing 160/94, R arm 150/80   ASSESSMENT AND PLAN:  While his BP is elevated in the office today, his home cuff gave a very similar reading at 156/102.  I suspect that since his home readings are much better, and his technique for using the cuff is good, that he truly does have some white coat hypertension.  He is working on diet/exercise as part of his DM treatment, so for now I am going to leave his lisinopril alone at 5mg  daily.  He is to continue home BP monitoring and call if he sees readings trend to greater than 140/80 (ADA goal).  I will see him again if his BP increases.    Tommy Medal PharmD CPP [facility]

## 2013-08-26 NOTE — Patient Instructions (Signed)
Your blood pressure today is 160/94  (goal <140/80)  Check your blood pressure at home several times per week and keep record of the readings.  Take your BP meds as follows: continue with lisinopril 5mg  daily  Exercise as you're able, try to walk approximately 30 minutes per day.  Keep salt intake to a minimum, especially watch canned and prepared boxed foods.  Eat more fresh fruits and vegetables and fewer canned items.  Avoid eating in fast food restaurants.    HOW TO TAKE YOUR BLOOD PRESSURE:   Rest 5 minutes before taking your blood pressure.    Don't smoke or drink caffeinated beverages for at least 30 minutes before.   Take your blood pressure before (not after) you eat.   Sit comfortably with your back supported and both feet on the floor (don't cross your legs).   Elevate your arm to heart level on a table or a desk.   Use the proper sized cuff. It should fit smoothly and snugly around your bare upper arm. There should be enough room to slip a fingertip under the cuff. The bottom edge of the cuff should be 1 inch above the crease of the elbow.   Ideally, take 3 measurements at one sitting and record the average.

## 2013-09-17 ENCOUNTER — Ambulatory Visit (INDEPENDENT_AMBULATORY_CARE_PROVIDER_SITE_OTHER): Payer: BC Managed Care – PPO | Admitting: Neurology

## 2013-09-17 ENCOUNTER — Encounter: Payer: Self-pay | Admitting: Neurology

## 2013-09-17 VITALS — BP 154/90 | HR 92 | Ht 69.75 in | Wt 176.0 lb

## 2013-09-17 DIAGNOSIS — R51 Headache: Secondary | ICD-10-CM

## 2013-09-17 NOTE — Progress Notes (Signed)
[facility]    Provider:  Dr Janann Colonel Referring Provider: Horatio Pel,* Primary Care Physician:  Horatio Pel, MD  CC:  headache  HPI:  James Booth is a 63 y.o. male here as a referral from Dr. Shelia Media for new onset headache and dizziness.  Notes symptoms started around 1 month ago. Described as a severe pressure type pain, note a congested sensation in his ears and nasal region. Chronic and constant, will fluctuate in severity. No nausea or emesis. No photo or phonophobia. No change in vision with the vision. No focal motor or sensory changes. Notes some light headed sensation with the headache. Severity varies throughout the day. No change with change in position. Headache is predominantly located in bifrontal and maxillary regions, feels like an internal pressure. Notes some difficulty breathing, nasal congestion. Takes a nasal decongestant which improves breathing but does not resolve the pressure. Has history of seasonal allergies, lately it has become more constant. Currently taking 1 to 2 advil a day.   Has type 2 DM, controlled with diet. Recently diagnosed with HTN, taking losartan, notes his SBP fluctuates from 120s to 150s when checking at home. Otherwise healthy. Has history of prostate CA in 2004, treated with RT and radical prostectomy.   Review of Systems: Out of a complete 14 system review, the patient complains of only the following symptoms, and all other reviewed systems are negative. + headache, snoring, ringing ears  History   Social History  . Marital Status: Divorced    Spouse Name: N/A    Number of Children: 3  . Years of Education: BS    Occupational History  . Not on file.   Social History Main Topics  . Smoking status: Never Smoker   . Smokeless tobacco: Never Used  . Alcohol Use: Yes     Comment: Daily 2-3 Rum and coke   . Drug Use: No  . Sexual Activity: Not on file   Other Topics Concern  . Not on file    Social History Narrative   Patient has 2 sons and 1 daughter.    Patient is living with girlfriend. [name]    Patient is divorced.    Patient has a B.S    Patient is right handed.    Patient is working at [employer].             History reviewed. No pertinent family history.  Past Medical History  Diagnosis Date  . Hypertension   . Family history of heart disease   . Nephrolithiasis   . Prostate cancer     Past Surgical History  Procedure Laterality Date  . Prostatectomy    . Tonsillectomy    . Varicocelectomy      Current Outpatient Prescriptions  Medication Sig Dispense Refill  . DYMISTA 137-50 MCG/ACT SUSP Place 1 spray into the nose as needed.      Marland Kitchen losartan (COZAAR) 25 MG tablet       . ONE TOUCH ULTRA TEST test strip       . pantoprazole (PROTONIX) 40 MG tablet Take 1 tablet by mouth daily as needed.        No current facility-administered medications for this visit.    Allergies as of 09/17/2013 - Review Complete 09/17/2013  Allergen Reaction Noted  . Aspirin Nausea Only 10/16/2012    Vitals: BP 154/90  Pulse 92  Ht 5' 9.75" (1.772 m)  Wt 176 lb (79.833 kg)  BMI 25.42 kg/m2 Last Weight:  Wt Readings from Last 1 Encounters:  09/17/13 176 lb (79.833 kg)   Last Height:   Ht Readings from Last 1 Encounters:  09/17/13 5' 9.75" (1.772 m)     Physical exam: Exam: Gen: NAD, conversant Eyes: anicteric sclerae, moist conjunctivae HENT: Atraumatic, oropharynx clear Neck: Trachea midline; supple,  Lungs: CTA, no wheezing, rales, rhonic                          CV: RRR, no MRG Abdomen: Soft, non-tender;  Extremities: No peripheral edema  Skin: Normal temperature, no rash,  Psych: Appropriate affect, pleasant  Neuro: MS: AA&Ox3, appropriately interactive, normal affect   Attention: WORLD backwards  Speech: fluent w/o paraphasic error  Memory: good recent and remote recall  CN: PERRL, VFF to FC bilat, unable to fully visualize fundus due to  pupil size, EOMI no nystagmus, no ptosis, sensation intact to LT V1-V3 bilat, face symmetric, no weakness, hearing grossly intact, palate elevates symmetrically, shoulder shrug 5/5 bilat,  tongue protrudes midline, no fasiculations noted.  Motor: normal bulk and tone Strength: 5/5  In all extremities  Coord: rapid alternating and point-to-point (FNF, HTS) movements intact.  Reflexes: symmetrical, bilat downgoing toes  Sens: LT intact in all extremities  Gait: posture, stance, stride and arm-swing normal. Tandem gait intact. Able to walk on heels and toes. Romberg absent.   Assessment:  After physical and neurologic examination, review of laboratory studies, imaging, neurophysiology testing and pre-existing records, assessment will be reviewed on the problem list.  Plan:  Treatment plan and additional workup will be reviewed under Problem List.  1)Headache 2)HTN  63y/o gentleman presenting for initial evaluation of chronic headache. Physical exam is overall unremarkable. Based on clinical description this is most consistent with a diagnosis of a sinus headache. Differential would also include tension type vs BP related vs cervicogenic headache. History of snoring also raises question of sleep apnea. Based on age of onset and history of prostate CA would need to consider a central structural process though this is less likely based on history and normal exam. Will check head CT. Pending results would consider referral to ENT and/or sleep study. Will continue ibuprofen for symptomatic relief. Hold off on further treatment pending CT results.    Jim Like, DO  [facility] [address] [place], [place] [address]  Phone [phone] Fax [phone]

## 2013-09-17 NOTE — Patient Instructions (Signed)
Overall you are doing fairly well but I do want to suggest a few things today:   Remember to drink plenty of fluid, eat healthy meals and do not skip any meals. Try to eat protein with a every meal and eat a healthy snack such as fruit or nuts in between meals. Try to keep a regular sleep-wake schedule and try to exercise daily, particularly in the form of walking, 20-30 minutes a day, if you can.   As far as diagnostic testing:  1)I would like you to have a CT of the head completed. Please call [facility] to schedule this.   I will follow up with you once the CT is completed. Please call us with any interim questions, concerns, problems, updates or refill requests.   My clinical assistant and will answer any of your questions and relay your messages to me and also relay most of my messages to you.   Our phone number is [phone]. We also have an after hours call service for urgent matters and there is a physician on-call for urgent questions. For any emergencies you know to call 911 or go to the nearest emergency room

## 2013-09-24 ENCOUNTER — Ambulatory Visit
Admission: RE | Admit: 2013-09-24 | Discharge: 2013-09-24 | Disposition: A | Discharge status: Home / Self Care | Payer: BC Managed Care – PPO | Source: Ambulatory Visit | Attending: Neurology | Admitting: Neurology

## 2013-09-24 DIAGNOSIS — R51 Headache: Secondary | ICD-10-CM

## 2013-09-25 ENCOUNTER — Telehealth: Payer: Self-pay | Admitting: Neurology

## 2013-09-25 NOTE — Telephone Encounter (Signed)
Please let him know it is not read yet. I will contact him as soon as I have the results. Thanks.

## 2013-09-25 NOTE — Telephone Encounter (Signed)
Patient calling wanting CT results that were done on 09-24-13. I do not see the report in the system. Please advise.

## 2013-09-25 NOTE — Telephone Encounter (Signed)
Patient requesting CT scan results.  Please call anytime and may leave detailed message if not available.

## 2013-09-28 ENCOUNTER — Other Ambulatory Visit: Payer: Self-pay | Admitting: Neurology

## 2013-09-28 DIAGNOSIS — R51 Headache: Secondary | ICD-10-CM

## 2013-09-28 NOTE — Telephone Encounter (Signed)
Referral sent by [facility]

## 2013-09-28 NOTE — Telephone Encounter (Signed)
Called patient and he is aware of his results. Patient would like to know what his next step is now since he is still having problems.

## 2013-09-28 NOTE — Telephone Encounter (Signed)
Returned call. No answer. Left message informing him that I would like to refer him to ENT and that a referral would be placed.

## 2013-10-26 ENCOUNTER — Other Ambulatory Visit: Payer: Self-pay | Admitting: *Deleted

## 2014-02-17 ENCOUNTER — Other Ambulatory Visit: Payer: Self-pay | Admitting: Otolaryngology

## 2014-02-17 DIAGNOSIS — D497 Neoplasm of unspecified behavior of endocrine glands and other parts of nervous system: Secondary | ICD-10-CM

## 2014-02-17 DIAGNOSIS — H9201 Otalgia, right ear: Secondary | ICD-10-CM

## 2014-02-18 ENCOUNTER — Ambulatory Visit
Admission: RE | Admit: 2014-02-18 | Discharge: 2014-02-18 | Disposition: A | Discharge status: Home / Self Care | Payer: 59 | Source: Ambulatory Visit | Attending: Otolaryngology | Admitting: Otolaryngology

## 2014-02-18 DIAGNOSIS — H9201 Otalgia, right ear: Secondary | ICD-10-CM

## 2014-02-18 DIAGNOSIS — D497 Neoplasm of unspecified behavior of endocrine glands and other parts of nervous system: Secondary | ICD-10-CM

## 2014-08-17 ENCOUNTER — Encounter: Payer: Self-pay | Admitting: Cardiovascular Disease

## 2015-01-04 DIAGNOSIS — E119 Type 2 diabetes mellitus without complications: Secondary | ICD-10-CM | POA: Diagnosis not present

## 2015-01-06 DIAGNOSIS — E119 Type 2 diabetes mellitus without complications: Secondary | ICD-10-CM | POA: Diagnosis not present

## 2015-01-06 DIAGNOSIS — I10 Essential (primary) hypertension: Secondary | ICD-10-CM | POA: Diagnosis not present

## 2015-01-06 DIAGNOSIS — R591 Generalized enlarged lymph nodes: Secondary | ICD-10-CM | POA: Diagnosis not present

## 2015-05-02 ENCOUNTER — Encounter: Payer: Self-pay | Admitting: Sports Medicine

## 2015-05-02 ENCOUNTER — Ambulatory Visit (INDEPENDENT_AMBULATORY_CARE_PROVIDER_SITE_OTHER): Payer: Medicare Other | Admitting: Sports Medicine

## 2015-05-02 VITALS — BP 142/93 | Ht 69.0 in | Wt 170.0 lb

## 2015-05-02 DIAGNOSIS — M25511 Pain in right shoulder: Secondary | ICD-10-CM

## 2015-05-02 DIAGNOSIS — M7501 Adhesive capsulitis of right shoulder: Secondary | ICD-10-CM | POA: Insufficient documentation

## 2015-05-02 MED ORDER — METHYLPREDNISOLONE ACETATE 40 MG/ML IJ SUSP
40.0000 mg | Freq: Once | INTRAMUSCULAR | Status: AC
  Administered 2015-05-02: 40 mg via INTRA_ARTICULAR

## 2015-05-02 MED ORDER — NORTRIPTYLINE HCL 25 MG PO CAPS: 25.0000 mg | ORAL_CAPSULE | Freq: Every day | ORAL | Status: DC

## 2015-05-02 NOTE — Patient Instructions (Signed)
Thanks for coming in today.   Use the nortryptilene daily at night.   Use the range of motion exercises that you have in the past.   Follow up in 4-6 weeks.   You may have increased pain in the next 24 hours in your shoulder. You can use ice or heat for this. It should feel better after that time.   Thanks for letting us take care of you.   Sincerely, Paula Compton, MD Family Medicine - PGY 2

## 2015-05-02 NOTE — Progress Notes (Signed)
   [facility] James Hacker, MD Phone: [phone]  Subjective:   # Right Shoulder Pain - History of left shoulder adhesive capsulitis.  - Has DMII - has had increasing pain for the past 5 months.  - Is losing range of motion.  - Has a difficult time reaching for his wallet in his back pocket, now carries it in his front pocket, cannot get his arm over his head or across his body.  - If he moves "the wrong way" he will get a jolt of severe pain in his shoulder that will "bring him to his knees".  - Taking ibuprofen for the pain.  - No swelling, redness.  - No joint tenderness to palpation.  - He has not had any injuries, and does not play any sports at this time, he also has not played contact sports when he was younger.   All relevant systems were reviewed and were negative unless otherwise noted in the HPI  Past Medical History Reviewed problem list.  Medications- reviewed and updated Current Outpatient Prescriptions  Medication Sig Dispense Refill  . DYMISTA 137-50 MCG/ACT SUSP Place 1 spray into the nose as needed.    Marland Kitchen lisinopril (PRINIVIL,ZESTRIL) 5 MG tablet     . losartan (COZAAR) 25 MG tablet     . nortriptyline (PAMELOR) 25 MG capsule Take 1 capsule (25 mg total) by mouth at bedtime. 30 capsule 3  . ONE TOUCH ULTRA TEST test strip     . pantoprazole (PROTONIX) 40 MG tablet Take 1 tablet by mouth daily as needed.      No current facility-administered medications for this visit.   Chief complaint-noted No additions to family history Social history- patient is a former smoker  ROS Denies neck pain No radicular sxs to arm or shoulder  Objective: BP 142/93 mmHg  Ht 5\' 9"  (1.753 m)  Wt 170 lb (77.111 kg)  BMI 25.09 kg/m2 NAD  Right Shoulder -  INSPECTION: No swelling, erythema, or gross deformity PALPATION: No crepitus, nontender to palpation.  RANGE OF MOTION: He has limited ROM with external rotation of the humerus past 10-20  degrees, Abduction limited at 90 degrees, flexion limited at 20 degrees.  STRENGTH: 5/5 motor in Supraspinatus, Infraspinatus, Deltoid, Biceps.  SPECIAL TESTS: Negative NEERS, Hawkins, Empty Can.  NEUROVASCULAR STATUS: Neurovascularly intact.  + frozen shoulder test  [place]: Intact Rotator Cuff to exam, no tears, no tendinopathy, no Bursitis visible, no Impingement.   Right Shoulder Injection Performed. Informed consent obtained and placed in chart.  Time out performed.  Area cleaned with alcohol swab.  Using 25 gauge 1 1/2 in needle 1 cc 40mg  Depo Medrol and 7 cc's 1% Xylocaine were injected in the glenohumoral space via posterior approach.  Sterile bandage placed.  Patient tolerated procedure well.  No complications.      Assessment/Plan:  Adhesive capsulitis of right shoulder Pt. 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 Predisposed given hx of DMII. [place] did not reveal any rotator cuff tears and exam demonstrated excellent strength in these muscle groups. Discussed with him the possible advantage of steroid injection in the shoulder which he elected to undergo as he is right handed and has some debility with his current symptoms.  - See procedure note above.  - Rx for Pamelor given for pain.  - Shoulder ROM exercises given, pt. To perform at home.  - F/U in 4-6 weeks.

## 2015-05-02 NOTE — Assessment & Plan Note (Signed)
Pt. 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 Predisposed given hx of DMII. [place] did not reveal any rotator cuff tears and exam demonstrated excellent strength in these muscle groups. Discussed with him the possible advantage of steroid injection in the shoulder which he elected to undergo as he is right handed and has some debility with his current symptoms.  - See procedure note above.  - Rx for Pamelor given for pain.  - Shoulder ROM exercises given, pt. To perform at home.  - F/U in 4-6 weeks.

## 2015-05-03 ENCOUNTER — Ambulatory Visit: Payer: 59 | Admitting: Sports Medicine

## 2015-06-28 ENCOUNTER — Ambulatory Visit (INDEPENDENT_AMBULATORY_CARE_PROVIDER_SITE_OTHER): Payer: Medicare Other | Admitting: Sports Medicine

## 2015-06-28 ENCOUNTER — Encounter: Payer: Self-pay | Admitting: Sports Medicine

## 2015-06-28 VITALS — BP 150/89 | HR 87 | Ht 69.0 in | Wt 170.0 lb

## 2015-06-28 DIAGNOSIS — M7501 Adhesive capsulitis of right shoulder: Secondary | ICD-10-CM

## 2015-06-28 MED ORDER — METHYLPREDNISOLONE ACETATE 40 MG/ML IJ SUSP
40.0000 mg | Freq: Once | INTRAMUSCULAR | Status: AC
  Administered 2015-06-28: 40 mg via INTRA_ARTICULAR

## 2015-06-28 NOTE — Progress Notes (Signed)
  James Booth - 65 y.o. male MRN [id number]  Date of birth: March 14, 1950  SUBJECTIVE:     James Booth is a right hand-dominant 65 y.o. male returning for follow up of frozen right shoulder.   He endorses about 6 months of stable constant frozen shoulder with pain on movement at the end of his ROM. Pain is poorly generalized to the lateral aspect of his upper arm not radiating below the elbow. Commonly keeps him awake at night, especially when laying on the right side. He denies numbness, tingling, or weakness in the arm. Further denies any injury to the shoulder.   He was seen here 05/02/15, where an ultrasound exam was negative for cuff pathology. He was given an injection and Rx pamelor. He believes he received very limited, if any, benefit from this, but has continued performing exercises multiple times daily. He takes ibuprofen as needed for pain.  He does note some improvement in motion of the shoulder   ROS:     As above Note no numbness in feet No 3 P sxs No neck pain  PERTINENT  PMH / PSH FH / / SH:  Past Medical, Surgical, Social, and Family History Reviewed & Updated in the EMR.  Pertinent findings include:  T2DM with well controlled CBGs, left adhesive capsulitis in the remote past that resolved.   OBJECTIVE: BP 150/89 mmHg  Pulse 87  Ht 5\' 9"  (1.753 m)  Wt 170 lb (77.111 kg)  BMI 25.09 kg/m2  Physical Exam:  Vital signs are reviewed. Gen: Well-appearing 65 y.o.male in NAD Shoulder: Inspection reveals no deformities, malalignment, atrophy or asymmetry. Palpation is normal with no tenderness over AC joint or bicipital groove. Passive ROM limited to < 5 deg external rotation with shoulder abducted. Now able to touch hands behind back. Cuff strength intact. Sensation in tact and cap refill < 2 sec. Biggest limits are ER and full elevation Improved 5 to 10 deg from last visit   PROCEDURE NOTE: RIGHT SHOULDER INJECTION Informed consent obtained.Time out performed. Area cleaned  with alcohol swab.EC spray;Using 25 gauge 1 1/2 in needle, 1 cc 40mg  Depo Medrol and 7 cc's 1% Xylocaine were injected in the glenohumoral space via posterior approach.Sterile bandage placed.Patient tolerated procedure well.No complications.   ASSESSMENT & PLAN: James Booth is a 65 y.o. 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  Given limited improvement since last visit, expected course of disease was reviewed, including the self-limited nature of adhesive capsulitis. Treatment options were reviewed. Physical therapy was offered and declined for now. Because pamelor did not seem to confer much benefit, this will be discontinued with the caveat that any worsening of nighttime pain or trouble sleeping may be caused by withdrawal of the medication. He will restart the medication if he notes these effects. He opted to repeat shoulder injection, which was performed. This did not increase CBGs after last injection. He will continue exercises and return for follow up in 2 - 3 months or sooner as needed.

## 2015-06-28 NOTE — Assessment & Plan Note (Signed)
See visit note  CSI # 2 today  Cont on HEP and push this a bit  Reck 3 mos

## 2015-07-05 DIAGNOSIS — E119 Type 2 diabetes mellitus without complications: Secondary | ICD-10-CM | POA: Diagnosis not present

## 2015-07-05 DIAGNOSIS — I10 Essential (primary) hypertension: Secondary | ICD-10-CM | POA: Diagnosis not present

## 2015-07-05 DIAGNOSIS — N390 Urinary tract infection, site not specified: Secondary | ICD-10-CM | POA: Diagnosis not present

## 2015-07-12 DIAGNOSIS — B078 Other viral warts: Secondary | ICD-10-CM | POA: Diagnosis not present

## 2015-07-12 DIAGNOSIS — I10 Essential (primary) hypertension: Secondary | ICD-10-CM | POA: Diagnosis not present

## 2015-07-12 DIAGNOSIS — Z923 Personal history of irradiation: Secondary | ICD-10-CM | POA: Diagnosis not present

## 2015-07-12 DIAGNOSIS — E119 Type 2 diabetes mellitus without complications: Secondary | ICD-10-CM | POA: Diagnosis not present

## 2015-07-12 DIAGNOSIS — E785 Hyperlipidemia, unspecified: Secondary | ICD-10-CM | POA: Diagnosis not present

## 2015-08-17 ENCOUNTER — Other Ambulatory Visit: Payer: Self-pay | Admitting: *Deleted

## 2015-08-17 MED ORDER — NORTRIPTYLINE HCL 25 MG PO CAPS: 25.0000 mg | ORAL_CAPSULE | Freq: Every day | ORAL | Status: DC

## 2015-10-28 ENCOUNTER — Other Ambulatory Visit: Payer: Self-pay | Admitting: *Deleted

## 2015-10-28 MED ORDER — NORTRIPTYLINE HCL 25 MG PO CAPS: 25.0000 mg | ORAL_CAPSULE | Freq: Every day | ORAL | 3 refills | Status: DC

## 2015-11-01 ENCOUNTER — Other Ambulatory Visit: Payer: Self-pay | Admitting: *Deleted

## 2015-11-01 DIAGNOSIS — D1801 Hemangioma of skin and subcutaneous tissue: Secondary | ICD-10-CM | POA: Diagnosis not present

## 2015-11-01 DIAGNOSIS — Z85828 Personal history of other malignant neoplasm of skin: Secondary | ICD-10-CM | POA: Diagnosis not present

## 2015-11-01 DIAGNOSIS — L821 Other seborrheic keratosis: Secondary | ICD-10-CM | POA: Diagnosis not present

## 2015-11-01 MED ORDER — NORTRIPTYLINE HCL 25 MG PO CAPS: 25.0000 mg | ORAL_CAPSULE | Freq: Every day | ORAL | 3 refills | Status: DC

## 2015-11-03 DIAGNOSIS — Z23 Encounter for immunization: Secondary | ICD-10-CM | POA: Diagnosis not present

## 2015-11-14 DIAGNOSIS — E119 Type 2 diabetes mellitus without complications: Secondary | ICD-10-CM | POA: Diagnosis not present

## 2015-11-14 DIAGNOSIS — I10 Essential (primary) hypertension: Secondary | ICD-10-CM | POA: Diagnosis not present

## 2016-01-06 DIAGNOSIS — I10 Essential (primary) hypertension: Secondary | ICD-10-CM | POA: Diagnosis not present

## 2016-01-09 DIAGNOSIS — I10 Essential (primary) hypertension: Secondary | ICD-10-CM | POA: Diagnosis not present

## 2016-01-13 DIAGNOSIS — R6889 Other general symptoms and signs: Secondary | ICD-10-CM | POA: Diagnosis not present

## 2016-01-13 DIAGNOSIS — I10 Essential (primary) hypertension: Secondary | ICD-10-CM | POA: Diagnosis not present

## 2016-02-22 DIAGNOSIS — K219 Gastro-esophageal reflux disease without esophagitis: Secondary | ICD-10-CM | POA: Diagnosis not present

## 2016-02-22 DIAGNOSIS — R490 Dysphonia: Secondary | ICD-10-CM | POA: Diagnosis not present

## 2016-04-04 DIAGNOSIS — R490 Dysphonia: Secondary | ICD-10-CM | POA: Diagnosis not present

## 2016-04-04 DIAGNOSIS — R070 Pain in throat: Secondary | ICD-10-CM | POA: Diagnosis not present

## 2016-07-13 DIAGNOSIS — Z125 Encounter for screening for malignant neoplasm of prostate: Secondary | ICD-10-CM | POA: Diagnosis not present

## 2016-07-13 DIAGNOSIS — E119 Type 2 diabetes mellitus without complications: Secondary | ICD-10-CM | POA: Diagnosis not present

## 2016-07-13 DIAGNOSIS — I10 Essential (primary) hypertension: Secondary | ICD-10-CM | POA: Diagnosis not present

## 2016-07-13 DIAGNOSIS — E785 Hyperlipidemia, unspecified: Secondary | ICD-10-CM | POA: Diagnosis not present

## 2016-07-16 DIAGNOSIS — Z8546 Personal history of malignant neoplasm of prostate: Secondary | ICD-10-CM | POA: Diagnosis not present

## 2016-07-16 DIAGNOSIS — R070 Pain in throat: Secondary | ICD-10-CM | POA: Diagnosis not present

## 2016-07-16 DIAGNOSIS — N529 Male erectile dysfunction, unspecified: Secondary | ICD-10-CM | POA: Diagnosis not present

## 2016-07-16 DIAGNOSIS — J309 Allergic rhinitis, unspecified: Secondary | ICD-10-CM | POA: Diagnosis not present

## 2016-10-31 DIAGNOSIS — Z23 Encounter for immunization: Secondary | ICD-10-CM | POA: Diagnosis not present

## 2016-11-02 DIAGNOSIS — L821 Other seborrheic keratosis: Secondary | ICD-10-CM | POA: Diagnosis not present

## 2016-11-02 DIAGNOSIS — D1801 Hemangioma of skin and subcutaneous tissue: Secondary | ICD-10-CM | POA: Diagnosis not present

## 2016-11-02 DIAGNOSIS — Z85828 Personal history of other malignant neoplasm of skin: Secondary | ICD-10-CM | POA: Diagnosis not present

## 2016-11-02 DIAGNOSIS — D225 Melanocytic nevi of trunk: Secondary | ICD-10-CM | POA: Diagnosis not present

## 2017-01-15 DIAGNOSIS — E119 Type 2 diabetes mellitus without complications: Secondary | ICD-10-CM | POA: Diagnosis not present

## 2017-01-15 DIAGNOSIS — E785 Hyperlipidemia, unspecified: Secondary | ICD-10-CM | POA: Diagnosis not present

## 2017-01-15 DIAGNOSIS — E875 Hyperkalemia: Secondary | ICD-10-CM | POA: Diagnosis not present

## 2017-01-15 DIAGNOSIS — I10 Essential (primary) hypertension: Secondary | ICD-10-CM | POA: Diagnosis not present

## 2017-07-16 DIAGNOSIS — I10 Essential (primary) hypertension: Secondary | ICD-10-CM | POA: Diagnosis not present

## 2017-07-16 DIAGNOSIS — E119 Type 2 diabetes mellitus without complications: Secondary | ICD-10-CM | POA: Diagnosis not present

## 2017-07-16 DIAGNOSIS — Z125 Encounter for screening for malignant neoplasm of prostate: Secondary | ICD-10-CM | POA: Diagnosis not present

## 2017-07-16 DIAGNOSIS — E785 Hyperlipidemia, unspecified: Secondary | ICD-10-CM | POA: Diagnosis not present

## 2017-07-19 DIAGNOSIS — I10 Essential (primary) hypertension: Secondary | ICD-10-CM | POA: Diagnosis not present

## 2017-07-19 DIAGNOSIS — N529 Male erectile dysfunction, unspecified: Secondary | ICD-10-CM | POA: Diagnosis not present

## 2017-07-19 DIAGNOSIS — E119 Type 2 diabetes mellitus without complications: Secondary | ICD-10-CM | POA: Diagnosis not present

## 2017-07-19 DIAGNOSIS — I493 Ventricular premature depolarization: Secondary | ICD-10-CM | POA: Diagnosis not present

## 2017-07-19 DIAGNOSIS — H9190 Unspecified hearing loss, unspecified ear: Secondary | ICD-10-CM | POA: Diagnosis not present

## 2017-07-19 DIAGNOSIS — E785 Hyperlipidemia, unspecified: Secondary | ICD-10-CM | POA: Diagnosis not present

## 2017-07-19 DIAGNOSIS — N200 Calculus of kidney: Secondary | ICD-10-CM | POA: Diagnosis not present

## 2017-07-19 DIAGNOSIS — Z1212 Encounter for screening for malignant neoplasm of rectum: Secondary | ICD-10-CM | POA: Diagnosis not present

## 2017-07-19 DIAGNOSIS — Z8546 Personal history of malignant neoplasm of prostate: Secondary | ICD-10-CM | POA: Diagnosis not present

## 2017-07-19 DIAGNOSIS — M7500 Adhesive capsulitis of unspecified shoulder: Secondary | ICD-10-CM | POA: Diagnosis not present

## 2017-07-19 DIAGNOSIS — Z0000 Encounter for general adult medical examination without abnormal findings: Secondary | ICD-10-CM | POA: Diagnosis not present

## 2017-07-19 DIAGNOSIS — J309 Allergic rhinitis, unspecified: Secondary | ICD-10-CM | POA: Diagnosis not present

## 2017-09-11 DIAGNOSIS — Z23 Encounter for immunization: Secondary | ICD-10-CM | POA: Diagnosis not present

## 2017-10-18 DIAGNOSIS — Z23 Encounter for immunization: Secondary | ICD-10-CM | POA: Diagnosis not present

## 2017-10-18 DIAGNOSIS — K219 Gastro-esophageal reflux disease without esophagitis: Secondary | ICD-10-CM | POA: Diagnosis not present

## 2017-10-18 DIAGNOSIS — E119 Type 2 diabetes mellitus without complications: Secondary | ICD-10-CM | POA: Diagnosis not present

## 2017-10-18 DIAGNOSIS — E785 Hyperlipidemia, unspecified: Secondary | ICD-10-CM | POA: Diagnosis not present

## 2017-11-04 DIAGNOSIS — L821 Other seborrheic keratosis: Secondary | ICD-10-CM | POA: Diagnosis not present

## 2017-11-04 DIAGNOSIS — L812 Freckles: Secondary | ICD-10-CM | POA: Diagnosis not present

## 2017-11-04 DIAGNOSIS — Z85828 Personal history of other malignant neoplasm of skin: Secondary | ICD-10-CM | POA: Diagnosis not present

## 2017-11-04 DIAGNOSIS — D1801 Hemangioma of skin and subcutaneous tissue: Secondary | ICD-10-CM | POA: Diagnosis not present

## 2017-11-04 DIAGNOSIS — D225 Melanocytic nevi of trunk: Secondary | ICD-10-CM | POA: Diagnosis not present

## 2018-03-04 DIAGNOSIS — E119 Type 2 diabetes mellitus without complications: Secondary | ICD-10-CM | POA: Diagnosis not present

## 2018-03-04 DIAGNOSIS — K219 Gastro-esophageal reflux disease without esophagitis: Secondary | ICD-10-CM | POA: Diagnosis not present

## 2018-07-22 DIAGNOSIS — E119 Type 2 diabetes mellitus without complications: Secondary | ICD-10-CM | POA: Diagnosis not present

## 2018-07-22 DIAGNOSIS — E785 Hyperlipidemia, unspecified: Secondary | ICD-10-CM | POA: Diagnosis not present

## 2018-07-22 DIAGNOSIS — Z125 Encounter for screening for malignant neoplasm of prostate: Secondary | ICD-10-CM | POA: Diagnosis not present

## 2018-07-22 DIAGNOSIS — I10 Essential (primary) hypertension: Secondary | ICD-10-CM | POA: Diagnosis not present

## 2018-07-22 DIAGNOSIS — Z1159 Encounter for screening for other viral diseases: Secondary | ICD-10-CM | POA: Diagnosis not present

## 2018-07-25 DIAGNOSIS — N529 Male erectile dysfunction, unspecified: Secondary | ICD-10-CM | POA: Diagnosis not present

## 2018-07-25 DIAGNOSIS — K219 Gastro-esophageal reflux disease without esophagitis: Secondary | ICD-10-CM | POA: Diagnosis not present

## 2018-07-25 DIAGNOSIS — I10 Essential (primary) hypertension: Secondary | ICD-10-CM | POA: Diagnosis not present

## 2018-07-25 DIAGNOSIS — N200 Calculus of kidney: Secondary | ICD-10-CM | POA: Diagnosis not present

## 2018-07-25 DIAGNOSIS — Z1212 Encounter for screening for malignant neoplasm of rectum: Secondary | ICD-10-CM | POA: Diagnosis not present

## 2018-07-25 DIAGNOSIS — Z8546 Personal history of malignant neoplasm of prostate: Secondary | ICD-10-CM | POA: Diagnosis not present

## 2018-07-25 DIAGNOSIS — H9190 Unspecified hearing loss, unspecified ear: Secondary | ICD-10-CM | POA: Diagnosis not present

## 2018-07-25 DIAGNOSIS — I493 Ventricular premature depolarization: Secondary | ICD-10-CM | POA: Diagnosis not present

## 2018-07-25 DIAGNOSIS — E118 Type 2 diabetes mellitus with unspecified complications: Secondary | ICD-10-CM | POA: Diagnosis not present

## 2018-07-25 DIAGNOSIS — J309 Allergic rhinitis, unspecified: Secondary | ICD-10-CM | POA: Diagnosis not present

## 2018-07-25 DIAGNOSIS — R1310 Dysphagia, unspecified: Secondary | ICD-10-CM | POA: Diagnosis not present

## 2018-08-13 DIAGNOSIS — R1310 Dysphagia, unspecified: Secondary | ICD-10-CM | POA: Diagnosis not present

## 2018-08-13 DIAGNOSIS — Z86010 Personal history of colonic polyps: Secondary | ICD-10-CM | POA: Diagnosis not present

## 2018-08-14 ENCOUNTER — Other Ambulatory Visit: Payer: Self-pay | Admitting: Gastroenterology

## 2018-08-14 DIAGNOSIS — R1310 Dysphagia, unspecified: Secondary | ICD-10-CM

## 2018-08-15 DIAGNOSIS — K219 Gastro-esophageal reflux disease without esophagitis: Secondary | ICD-10-CM | POA: Diagnosis not present

## 2018-08-15 DIAGNOSIS — H838X3 Other specified diseases of inner ear, bilateral: Secondary | ICD-10-CM | POA: Diagnosis not present

## 2018-08-15 DIAGNOSIS — R070 Pain in throat: Secondary | ICD-10-CM | POA: Diagnosis not present

## 2018-08-15 DIAGNOSIS — R1312 Dysphagia, oropharyngeal phase: Secondary | ICD-10-CM | POA: Diagnosis not present

## 2018-08-15 DIAGNOSIS — H903 Sensorineural hearing loss, bilateral: Secondary | ICD-10-CM | POA: Diagnosis not present

## 2018-08-19 ENCOUNTER — Ambulatory Visit
Admission: RE | Admit: 2018-08-19 | Discharge: 2018-08-19 | Disposition: A | Discharge status: Home / Self Care | Payer: Medicare Other | Source: Ambulatory Visit | Attending: Gastroenterology | Admitting: Gastroenterology

## 2018-08-19 ENCOUNTER — Other Ambulatory Visit: Payer: Self-pay

## 2018-08-19 DIAGNOSIS — R1310 Dysphagia, unspecified: Secondary | ICD-10-CM

## 2018-08-19 DIAGNOSIS — K449 Diaphragmatic hernia without obstruction or gangrene: Secondary | ICD-10-CM | POA: Diagnosis not present

## 2018-08-21 ENCOUNTER — Other Ambulatory Visit: Payer: Self-pay

## 2018-08-21 DIAGNOSIS — Z20822 Contact with and (suspected) exposure to covid-19: Secondary | ICD-10-CM

## 2018-08-24 LAB — NOVEL CORONAVIRUS, NAA: SARS-CoV-2, NAA: NOT DETECTED

## 2018-09-12 ENCOUNTER — Other Ambulatory Visit: Payer: Self-pay

## 2018-09-12 ENCOUNTER — Encounter (HOSPITAL_BASED_OUTPATIENT_CLINIC_OR_DEPARTMENT_OTHER): Payer: Self-pay

## 2018-09-12 ENCOUNTER — Emergency Department (HOSPITAL_BASED_OUTPATIENT_CLINIC_OR_DEPARTMENT_OTHER)
Admission: EM | Admit: 2018-09-12 | Discharge: 2018-09-12 | Disposition: A | Discharge status: Home / Self Care | Payer: Medicare Other | Attending: Emergency Medicine | Admitting: Emergency Medicine

## 2018-09-12 DIAGNOSIS — Y929 Unspecified place or not applicable: Secondary | ICD-10-CM | POA: Insufficient documentation

## 2018-09-12 DIAGNOSIS — E119 Type 2 diabetes mellitus without complications: Secondary | ICD-10-CM | POA: Diagnosis not present

## 2018-09-12 DIAGNOSIS — Z2914 Encounter for prophylactic rabies immune globin: Secondary | ICD-10-CM | POA: Diagnosis not present

## 2018-09-12 DIAGNOSIS — S61231A Puncture wound without foreign body of left index finger without damage to nail, initial encounter: Secondary | ICD-10-CM | POA: Diagnosis not present

## 2018-09-12 DIAGNOSIS — Z203 Contact with and (suspected) exposure to rabies: Secondary | ICD-10-CM | POA: Diagnosis not present

## 2018-09-12 DIAGNOSIS — Z8546 Personal history of malignant neoplasm of prostate: Secondary | ICD-10-CM | POA: Diagnosis not present

## 2018-09-12 DIAGNOSIS — Y999 Unspecified external cause status: Secondary | ICD-10-CM | POA: Diagnosis not present

## 2018-09-12 DIAGNOSIS — W5501XA Bitten by cat, initial encounter: Secondary | ICD-10-CM | POA: Diagnosis not present

## 2018-09-12 DIAGNOSIS — I10 Essential (primary) hypertension: Secondary | ICD-10-CM | POA: Insufficient documentation

## 2018-09-12 DIAGNOSIS — Z23 Encounter for immunization: Secondary | ICD-10-CM | POA: Insufficient documentation

## 2018-09-12 DIAGNOSIS — Y9389 Activity, other specified: Secondary | ICD-10-CM | POA: Diagnosis not present

## 2018-09-12 DIAGNOSIS — S61251A Open bite of left index finger without damage to nail, initial encounter: Secondary | ICD-10-CM | POA: Diagnosis not present

## 2018-09-12 HISTORY — DX: Type 2 diabetes mellitus without complications: E11.9

## 2018-09-12 HISTORY — DX: Esophageal obstruction: K22.2

## 2018-09-12 MED ORDER — AMOXICILLIN-POT CLAVULANATE 875-125 MG PO TABS: 1.0000 | ORAL_TABLET | Freq: Two times a day (BID) | ORAL | 0 refills | Status: AC

## 2018-09-12 MED ORDER — RABIES VACCINE, PCEC IM SUSR
1.0000 mL | Freq: Once | INTRAMUSCULAR | Status: AC
  Administered 2018-09-12: 1 mL via INTRAMUSCULAR
  Filled 2018-09-12: qty 1

## 2018-09-12 MED ORDER — RABIES IMMUNE GLOBULIN 150 UNIT/ML IM INJ
20.0000 [IU]/kg | INJECTION | Freq: Once | INTRAMUSCULAR | Status: AC
  Administered 2018-09-12: 1500 [IU] via INTRAMUSCULAR
  Filled 2018-09-12: qty 10

## 2018-09-12 NOTE — ED Notes (Signed)
No adverse reaction to medications noted.

## 2018-09-12 NOTE — ED Provider Notes (Signed)
[facility] Provider Note   CSN: [id number] Arrival date & time: 09/12/18  1625    History   Chief Complaint Chief Complaint  Patient presents with   Animal Bite    HPI James Booth is a 68 y.o. male.     The history is provided by the patient and medical records. No language interpreter was used.   James Booth is a 68 y.o. male  with a PMH as listed below who presents to the Emergency Department complaining of getting bitten by a kitten yesterday around 10 am.  Patient was trying to rescue kittens.  She had on gloves, but states that 1 of the kittens bit him and it poked through the glove.  He did immediately apply hand sanitizer and sprayed his finger with Lysol.  This morning, the kittens were still stuck in the drain and animal control was called to try to help with the situation.  They saw his wound when he mentioned what had happened and did recommend that he get rabies vaccines as he is unsure which kitten and they are unable to observe the animals appropriately.  His tetanus is up-to-date.  Past Medical History:  Diagnosis Date   Diabetes mellitus without complication ([place])    Esophageal stricture    Family history of heart disease    Hypertension    Nephrolithiasis    Prostate cancer [facility])     Patient Active Problem List   Diagnosis Date Noted   Adhesive capsulitis of right shoulder 05/02/2015   Essential hypertension 07/24/2013   Type II or unspecified type diabetes mellitus with ketoacidosis, not stated as uncontrolled 01/08/2013   Adhesive capsulitis of left shoulder 10/16/2012    Past Surgical History:  Procedure Laterality Date   PROSTATECTOMY     TONSILLECTOMY     VARICOCELECTOMY          Home Medications    Prior to Admission medications   Medication Sig Start Date End Date Taking? Authorizing Provider  amoxicillin-clavulanate (AUGMENTIN) 875-125 MG tablet Take 1 tablet by mouth every 12 (twelve)  hours for 5 days. 09/12/18 09/17/18  Kai Calico, Ozella Almond, PA-C  DYMISTA 137-50 MCG/ACT SUSP Place 1 spray into the nose as needed. 09/30/12   [provider]  lisinopril (PRINIVIL,ZESTRIL) 5 MG tablet  08/20/13   [provider]  losartan (COZAAR) 25 MG tablet  09/11/13   [provider]  nortriptyline (PAMELOR) 25 MG capsule Take 1 capsule (25 mg total) by mouth at bedtime. 11/01/15   Stefanie Libel, MD  ONE TOUCH ULTRA TEST test strip  08/04/13   [provider]  pantoprazole (PROTONIX) 40 MG tablet Take 1 tablet by mouth daily as needed.  06/23/13   [provider]    Family History Family History  Problem Relation Age of Onset   Heart disease Mother    ALS Father    Cancer Sister     Social History Social History   Tobacco Use   Smoking status: Never Smoker   Smokeless tobacco: Never Used  Substance Use Topics   Alcohol use: Not Currently   Drug use: No     Allergies   Aspirin   Review of Systems Review of Systems  Musculoskeletal: Negative for arthralgias and myalgias.  Skin: Positive for wound.  Neurological: Negative for weakness and numbness.     Physical Exam Updated Vital Signs BP (!) 142/95 (BP Location: Left Arm)    Pulse 92  Temp 98.3 F (36.8 C) (Oral)    Resp 18    Ht 5\' 9"  (1.753 m)    Wt 75.3 kg    SpO2 100%    BMI 24.51 kg/m   Physical Exam Vitals signs and nursing note reviewed.  Constitutional:      General: He is not in acute distress.    Appearance: He is well-developed.  HENT:     Head: Normocephalic and atraumatic.  Neck:     Musculoskeletal: Neck supple.  Cardiovascular:     Rate and Rhythm: Normal rate and regular rhythm.     Heart sounds: Normal heart sounds. No murmur.  Pulmonary:     Effort: Pulmonary effort is normal. No respiratory distress.     Breath sounds: Normal breath sounds. No wheezing or rales.  Musculoskeletal: Normal range of motion.  Skin:    General: Skin is warm and  dry.     Comments: Left index finger with pinpoint abrasion c/w small puncture as well as 1 cm abrasion. No surrounding erythema.   Neurological:     Mental Status: He is alert.      ED Treatments / Results  Labs (all labs ordered are listed, but only abnormal results are displayed) Labs Reviewed - No data to display  EKG None  Radiology No results found.  Procedures Procedures (including critical care time)  Medications Ordered in ED Medications  rabies vaccine (RABAVERT) injection 1 mL (has no administration in time range)  rabies immune globulin (HYPERAB/KEDRAB) injection 1,500 Units (has no administration in time range)     Initial Impression / Assessment and Plan / ED Course  I have reviewed the triage vital signs and the nursing notes.  Pertinent labs & imaging results that were available during my care of the patient were reviewed by me and considered in my medical decision making (see chart for details).        James Booth is a 68 y.o. male who presents to ED for evaluation after kitten bit his finger yesterday around 10 AM.  Unknown rabies status and animal control recommending rabies vaccination which was done today.  Wounds are very superficial and not needing any further repair or imaging currently.  Counseled on home wound care.  Will start on Augmentin for antibiotic prophylaxis.  Vaccination schedule was discussed for further rabies vaccines and included in AVS.  Reasons to return to ER including signs of infection discussed and questions answered.  Patient seen by and discussed with Dr. Darl Householder who agrees with treatment plan.    Final Clinical Impressions(s) / ED Diagnoses   Final diagnoses:  Cat bite, initial encounter    ED Discharge Orders         Ordered    amoxicillin-clavulanate (AUGMENTIN) 875-125 MG tablet  Every 12 hours     09/12/18 1742           Shayley Medlin, Ozella Almond, PA-C 09/12/18 1750    Drenda Freeze, MD 09/13/18 [phone]

## 2018-09-12 NOTE — Discharge Instructions (Signed)
It was my pleasure taking care of you today!   Please take all of your antibiotics until finished! This is to prevent infection.   You will need further rabies vaccines on 8/17, 8/21 and 8/28. This can typically be done at any urgent cares but I would call ahead to ensure they have vaccines available.   Return to ER for redness or swelling around your wounds, new or worsening symptoms, to get vaccines if you cannot at urgent care or for any additional concerns.

## 2018-09-12 NOTE — ED Triage Notes (Addendum)
Pt states he was bit left index finger by stray kitten yesterday-slight mark noted-kitten bit through glove-NAD-steady gait

## 2018-09-15 ENCOUNTER — Encounter: Payer: Self-pay | Admitting: Emergency Medicine

## 2018-09-15 ENCOUNTER — Ambulatory Visit
Admission: EM | Admit: 2018-09-15 | Discharge: 2018-09-15 | Disposition: A | Discharge status: Home / Self Care | Payer: Medicare Other | Attending: Physician Assistant | Admitting: Physician Assistant

## 2018-09-15 ENCOUNTER — Other Ambulatory Visit: Payer: Self-pay

## 2018-09-15 DIAGNOSIS — Z203 Contact with and (suspected) exposure to rabies: Secondary | ICD-10-CM

## 2018-09-15 DIAGNOSIS — Z23 Encounter for immunization: Secondary | ICD-10-CM | POA: Diagnosis not present

## 2018-09-15 MED ORDER — RABIES VACCINE, PCEC IM SUSR
1.0000 mL | Freq: Once | INTRAMUSCULAR | Status: AC
  Administered 2018-09-15: 1 mL via INTRAMUSCULAR

## 2018-09-15 NOTE — ED Triage Notes (Signed)
Pt here for day 3 rabies. ?

## 2018-09-19 ENCOUNTER — Ambulatory Visit
Admission: EM | Admit: 2018-09-19 | Discharge: 2018-09-19 | Disposition: A | Discharge status: Home / Self Care | Payer: Medicare Other | Attending: Physician Assistant | Admitting: Physician Assistant

## 2018-09-19 ENCOUNTER — Encounter: Payer: Self-pay | Admitting: Emergency Medicine

## 2018-09-19 ENCOUNTER — Other Ambulatory Visit: Payer: Self-pay

## 2018-09-19 DIAGNOSIS — Z203 Contact with and (suspected) exposure to rabies: Secondary | ICD-10-CM

## 2018-09-19 DIAGNOSIS — Z1159 Encounter for screening for other viral diseases: Secondary | ICD-10-CM | POA: Diagnosis not present

## 2018-09-19 DIAGNOSIS — Z23 Encounter for immunization: Secondary | ICD-10-CM

## 2018-09-19 MED ORDER — RABIES VACCINE, PCEC IM SUSR
1.0000 mL | Freq: Once | INTRAMUSCULAR | Status: AC
  Administered 2018-09-19: 11:00:00 1 mL via INTRAMUSCULAR

## 2018-09-19 NOTE — ED Triage Notes (Signed)
Pt presents for day 7 of rabies series.

## 2018-09-19 NOTE — ED Notes (Signed)
Patient able to ambulate independently  

## 2018-09-24 DIAGNOSIS — K209 Esophagitis, unspecified: Secondary | ICD-10-CM | POA: Diagnosis not present

## 2018-09-24 DIAGNOSIS — K228 Other specified diseases of esophagus: Secondary | ICD-10-CM | POA: Diagnosis not present

## 2018-09-24 DIAGNOSIS — R1310 Dysphagia, unspecified: Secondary | ICD-10-CM | POA: Diagnosis not present

## 2018-09-24 DIAGNOSIS — R933 Abnormal findings on diagnostic imaging of other parts of digestive tract: Secondary | ICD-10-CM | POA: Diagnosis not present

## 2018-09-26 ENCOUNTER — Ambulatory Visit
Admission: EM | Admit: 2018-09-26 | Discharge: 2018-09-26 | Disposition: A | Discharge status: Home / Self Care | Payer: Medicare Other | Attending: Emergency Medicine | Admitting: Emergency Medicine

## 2018-09-26 DIAGNOSIS — Z23 Encounter for immunization: Secondary | ICD-10-CM

## 2018-09-26 DIAGNOSIS — Z203 Contact with and (suspected) exposure to rabies: Secondary | ICD-10-CM

## 2018-09-26 MED ORDER — RABIES VACCINE, PCEC IM SUSR
1.0000 mL | Freq: Once | INTRAMUSCULAR | Status: AC
  Administered 2018-09-26: 1 mL via INTRAMUSCULAR

## 2018-09-26 NOTE — ED Triage Notes (Signed)
Here for day 14 rabies vaccine

## 2018-09-29 DIAGNOSIS — K209 Esophagitis, unspecified: Secondary | ICD-10-CM | POA: Diagnosis not present

## 2018-10-07 DIAGNOSIS — K209 Esophagitis, unspecified: Secondary | ICD-10-CM | POA: Diagnosis not present

## 2018-10-31 DIAGNOSIS — Z23 Encounter for immunization: Secondary | ICD-10-CM | POA: Diagnosis not present

## 2018-11-18 DIAGNOSIS — Z86010 Personal history of colonic polyps: Secondary | ICD-10-CM | POA: Diagnosis not present

## 2018-11-18 DIAGNOSIS — K200 Eosinophilic esophagitis: Secondary | ICD-10-CM | POA: Diagnosis not present

## 2019-02-20 ENCOUNTER — Ambulatory Visit: Payer: Medicare Other | Attending: Internal Medicine

## 2019-02-20 DIAGNOSIS — Z23 Encounter for immunization: Secondary | ICD-10-CM | POA: Insufficient documentation

## 2019-02-20 NOTE — Progress Notes (Signed)
   Covid-19 Vaccination Clinic  Name:  James Booth    MRN: [id number] DOB: 07/15/1950  02/20/2019  James Booth was observed post Covid-19 immunization for 15 minutes without incidence. He was provided with Vaccine Information Sheet and instruction to access the V-Safe system.   James Booth was instructed to call 911 with any severe reactions post vaccine: Marland Kitchen Difficulty breathing  . Swelling of your face and throat  . A fast heartbeat  . A bad rash all over your body  . Dizziness and weakness    Immunizations Administered    Name Date Dose VIS Date Route   Pfizer COVID-19 Vaccine 02/20/2019  5:16 PM 0.3 mL 01/09/2019 Intramuscular   Manufacturer: [place]   Lot: [id number]   [place]: [id number]

## 2019-03-13 ENCOUNTER — Other Ambulatory Visit: Payer: Self-pay

## 2019-03-13 ENCOUNTER — Ambulatory Visit: Payer: Medicare Other | Attending: Internal Medicine

## 2019-03-13 DIAGNOSIS — Z23 Encounter for immunization: Secondary | ICD-10-CM | POA: Insufficient documentation

## 2019-03-13 NOTE — Progress Notes (Signed)
   Covid-19 Vaccination Clinic  Name:  CAYDENCE FROMM    MRN: [id number] DOB: April 06, 1950  03/13/2019  Mr. Beth was observed post Covid-19 immunization for 15 minutes without incidence. He was provided with Vaccine Information Sheet and instruction to access the V-Safe system.   Mr. Vanalst was instructed to call 911 with any severe reactions post vaccine: Marland Kitchen Difficulty breathing  . Swelling of your face and throat  . A fast heartbeat  . A bad rash all over your body  . Dizziness and weakness    Immunizations Administered    Name Date Dose VIS Date Route   Pfizer COVID-19 Vaccine 03/13/2019  4:34 PM 0.3 mL 01/09/2019 Intramuscular   Manufacturer: [place]   Lot: [id number]   [place]: [id number]

## 2019-03-26 ENCOUNTER — Ambulatory Visit: Payer: Medicare Other

## 2019-03-26 DIAGNOSIS — Z1159 Encounter for screening for other viral diseases: Secondary | ICD-10-CM | POA: Diagnosis not present

## 2019-03-31 DIAGNOSIS — D123 Benign neoplasm of transverse colon: Secondary | ICD-10-CM | POA: Diagnosis not present

## 2019-03-31 DIAGNOSIS — K5730 Diverticulosis of large intestine without perforation or abscess without bleeding: Secondary | ICD-10-CM | POA: Diagnosis not present

## 2019-03-31 DIAGNOSIS — K648 Other hemorrhoids: Secondary | ICD-10-CM | POA: Diagnosis not present

## 2019-03-31 DIAGNOSIS — Z86010 Personal history of colonic polyps: Secondary | ICD-10-CM | POA: Diagnosis not present

## 2019-03-31 DIAGNOSIS — R1310 Dysphagia, unspecified: Secondary | ICD-10-CM | POA: Diagnosis not present

## 2019-03-31 DIAGNOSIS — K200 Eosinophilic esophagitis: Secondary | ICD-10-CM | POA: Diagnosis not present

## 2019-03-31 DIAGNOSIS — K222 Esophageal obstruction: Secondary | ICD-10-CM | POA: Diagnosis not present

## 2019-04-02 ENCOUNTER — Ambulatory Visit: Payer: Medicare Other

## 2019-04-03 DIAGNOSIS — D123 Benign neoplasm of transverse colon: Secondary | ICD-10-CM | POA: Diagnosis not present

## 2019-07-20 DIAGNOSIS — X58XXXA Exposure to other specified factors, initial encounter: Secondary | ICD-10-CM | POA: Diagnosis not present

## 2019-07-20 DIAGNOSIS — L089 Local infection of the skin and subcutaneous tissue, unspecified: Secondary | ICD-10-CM | POA: Diagnosis not present

## 2019-07-20 DIAGNOSIS — S60418A Abrasion of other finger, initial encounter: Secondary | ICD-10-CM | POA: Diagnosis not present

## 2019-08-12 DIAGNOSIS — E118 Type 2 diabetes mellitus with unspecified complications: Secondary | ICD-10-CM | POA: Diagnosis not present

## 2019-08-12 DIAGNOSIS — E785 Hyperlipidemia, unspecified: Secondary | ICD-10-CM | POA: Diagnosis not present

## 2019-08-20 DIAGNOSIS — I2510 Atherosclerotic heart disease of native coronary artery without angina pectoris: Secondary | ICD-10-CM | POA: Diagnosis not present

## 2019-08-20 DIAGNOSIS — E785 Hyperlipidemia, unspecified: Secondary | ICD-10-CM | POA: Diagnosis not present

## 2019-09-23 DIAGNOSIS — E785 Hyperlipidemia, unspecified: Secondary | ICD-10-CM | POA: Diagnosis not present

## 2019-10-01 DIAGNOSIS — E118 Type 2 diabetes mellitus with unspecified complications: Secondary | ICD-10-CM | POA: Diagnosis not present

## 2019-10-01 DIAGNOSIS — I10 Essential (primary) hypertension: Secondary | ICD-10-CM | POA: Diagnosis not present

## 2019-10-01 DIAGNOSIS — I2510 Atherosclerotic heart disease of native coronary artery without angina pectoris: Secondary | ICD-10-CM | POA: Diagnosis not present

## 2019-10-01 DIAGNOSIS — E785 Hyperlipidemia, unspecified: Secondary | ICD-10-CM | POA: Diagnosis not present

## 2019-10-14 ENCOUNTER — Encounter: Payer: Self-pay | Admitting: Cardiovascular Disease

## 2019-10-14 ENCOUNTER — Other Ambulatory Visit: Payer: Self-pay

## 2019-10-14 ENCOUNTER — Ambulatory Visit (INDEPENDENT_AMBULATORY_CARE_PROVIDER_SITE_OTHER): Payer: Medicare Other | Admitting: Cardiovascular Disease

## 2019-10-14 VITALS — BP 100/66 | HR 74 | Ht 68.0 in | Wt 166.0 lb

## 2019-10-14 DIAGNOSIS — E785 Hyperlipidemia, unspecified: Secondary | ICD-10-CM | POA: Insufficient documentation

## 2019-10-14 DIAGNOSIS — E782 Mixed hyperlipidemia: Secondary | ICD-10-CM

## 2019-10-14 DIAGNOSIS — I10 Essential (primary) hypertension: Secondary | ICD-10-CM | POA: Diagnosis not present

## 2019-10-14 NOTE — Progress Notes (Signed)
10/14/2019 SY SAINTJEAN   1950/06/02  [id number]  Primary Physician Deland Pretty, MD Primary Cardiologist: Lorretta Harp MD [name], [place]  HPI:  James Booth is a 69 y.o.  divorced Caucasian male father of Booth, who  lives with his long-time girlfriend. 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 I saw him 6 years ago His risk factors include family history with a mother who died at age 66 of myocardial infarction but otherwise is negative other than recently diagnosed type 2 diabetes and hypertension. He was never had a heart attack or stroke. At that time addendum a Myoview stress test was normal and his pain resolved. He does have a remote history of prostate cancer status post prostatectomy in 2004 by Dr. Irine Seal. He saw Dr. Deland Pretty recently for primary care who noted some PVCs and referred him back for further evaluation.  I did perform a Myoview stress test on him back in 2010 which was low risk nonischemic.  He is totally asymptomatic.  He did have a coronary calcium score performed 08/13/2019 which was essentially normal with a calcium score of only Booth.  Current Meds  Medication Sig  . metFORMIN (GLUCOPHAGE) 500 MG tablet Take 500 mg by mouth daily.  Marland Kitchen olmesartan-hydrochlorothiazide (BENICAR HCT) 20-12.5 MG tablet Take 1 tablet by mouth daily.  . ONE TOUCH ULTRA TEST test strip   . rosuvastatin (CRESTOR) 20 MG tablet Take 20 mg by mouth daily.  . Semaglutide (OZEMPIC, 0.25 OR 0.5 MG/DOSE, [place]) Inject into the skin.     Allergies  Allergen Reactions  . Aspirin Nausea Only    Acid reflux    Social History   Socioeconomic History  . Marital status: Divorced    Spouse name: Not on file  . Number of children: Booth  . Years of education: BS   . Highest education level: Not on file  Occupational History  . Not on file  Tobacco Use  . Smoking status: Never Smoker  . Smokeless tobacco: Never Used  Vaping Use  . Vaping Use: Never used    Substance and Sexual Activity  . Alcohol use: Not Currently  . Drug use: No  . Sexual activity: Not on file  Other Topics Concern  . Not on file  Social History Narrative   Patient has 2 sons and 1 daughter.    Patient is living with girlfriend. [name]    Patient is divorced.    Patient has a B.S    Patient is right handed.    Patient is working at [employer].            Social Determinants of Health   Financial Resource Strain:   . Difficulty of Paying Living Expenses: Not on file  Food Insecurity:   . Worried About Charity fundraiser in the Last Year: Not on file  . Ran Out of Food in the Last Year: Not on file  Transportation Needs:   . Lack of Transportation (Medical): Not on file  . Lack of Transportation (Non-Medical): Not on file  Physical Activity:   . Days of Exercise per Week: Not on file  . Minutes of Exercise per Session: Not on file  Stress:   . Feeling of Stress : Not on file  Social Connections:   . Frequency of Communication with Friends and Family: Not on file  . Frequency of Social Gatherings with Friends and Family: Not on  file  . Attends Religious Services: Not on file  . Active Member of Clubs or Organizations: Not on file  . Attends Archivist Meetings: Not on file  . Marital Status: Not on file  Intimate Partner Violence:   . Fear of Current or Ex-Partner: Not on file  . Emotionally Abused: Not on file  . Physically Abused: Not on file  . Sexually Abused: Not on file     Review of Systems: General: negative for chills, fever, night sweats or weight changes.  Cardiovascular: negative for chest pain, dyspnea on exertion, edema, orthopnea, palpitations, paroxysmal nocturnal dyspnea or shortness of breath Dermatological: negative for rash Respiratory: negative for cough or wheezing Urologic: negative for hematuria Abdominal: negative for nausea, vomiting, diarrhea, bright red blood per rectum, melena, or hematemesis Neurologic: negative  for visual changes, syncope, or dizziness All other systems reviewed and are otherwise negative except as noted above.    Blood pressure 100/66, pulse 74, height 5\' 8"  (1.727 m), weight 166 lb (75.Booth kg), SpO2 99 %.  General appearance: alert and no distress Neck: no adenopathy, no carotid bruit, no JVD, supple, symmetrical, trachea midline and thyroid not enlarged, symmetric, no tenderness/mass/nodules Lungs: clear to auscultation bilaterally Heart: regular rate and rhythm, S1, S2 normal, no murmur, click, rub or gallop Extremities: extremities normal, atraumatic, no cyanosis or edema Pulses: 2+ and symmetric Skin: Skin color, texture, turgor normal. No rashes or lesions Neurologic: Alert and oriented X Booth, normal strength and tone. Normal symmetric reflexes. Normal coordination and gait  EKG sinus rhythm at 74 without ST or T wave changes.  I personally reviewed this EKG.  ASSESSMENT AND PLAN:   Essential hypertension History of essential hypertension blood pressure measured today 100/66.  He is on Benicar and hydrochlorothiazide.  Hyperlipidemia History of hyperlipidemia on Crestor with recent lipid profile performed by his PCP 07/23/2019 revealing total cluster 109, LDL 56 and HDL of 40.      Lorretta Harp MD FACP,FACC,FAHA, [facility] 10/14/2019 4:28 PM

## 2019-10-14 NOTE — Assessment & Plan Note (Signed)
History of hyperlipidemia on Crestor with recent lipid profile performed by his PCP 07/23/2019 revealing total cluster 109, LDL 56 and HDL of 40.

## 2019-10-14 NOTE — Assessment & Plan Note (Signed)
History of essential hypertension blood pressure measured today 100/66.  He is on Benicar and hydrochlorothiazide.

## 2019-10-14 NOTE — Patient Instructions (Signed)

## 2019-10-19 DIAGNOSIS — K219 Gastro-esophageal reflux disease without esophagitis: Secondary | ICD-10-CM | POA: Diagnosis not present

## 2019-10-19 DIAGNOSIS — K200 Eosinophilic esophagitis: Secondary | ICD-10-CM | POA: Diagnosis not present

## 2019-10-19 DIAGNOSIS — Z86010 Personal history of colonic polyps: Secondary | ICD-10-CM | POA: Diagnosis not present

## 2019-10-22 DIAGNOSIS — K200 Eosinophilic esophagitis: Secondary | ICD-10-CM | POA: Diagnosis not present

## 2019-10-22 DIAGNOSIS — I10 Essential (primary) hypertension: Secondary | ICD-10-CM | POA: Diagnosis not present

## 2019-10-22 DIAGNOSIS — Z23 Encounter for immunization: Secondary | ICD-10-CM | POA: Diagnosis not present

## 2019-11-15 DIAGNOSIS — Z23 Encounter for immunization: Secondary | ICD-10-CM | POA: Diagnosis not present

## 2019-12-08 DIAGNOSIS — I2510 Atherosclerotic heart disease of native coronary artery without angina pectoris: Secondary | ICD-10-CM | POA: Diagnosis not present

## 2019-12-08 DIAGNOSIS — E118 Type 2 diabetes mellitus with unspecified complications: Secondary | ICD-10-CM | POA: Diagnosis not present

## 2019-12-08 DIAGNOSIS — E785 Hyperlipidemia, unspecified: Secondary | ICD-10-CM | POA: Diagnosis not present

## 2019-12-08 DIAGNOSIS — I10 Essential (primary) hypertension: Secondary | ICD-10-CM | POA: Diagnosis not present

## 2020-02-24 DIAGNOSIS — H2513 Age-related nuclear cataract, bilateral: Secondary | ICD-10-CM | POA: Diagnosis not present

## 2020-04-05 DIAGNOSIS — I2510 Atherosclerotic heart disease of native coronary artery without angina pectoris: Secondary | ICD-10-CM | POA: Diagnosis not present

## 2020-04-05 DIAGNOSIS — E118 Type 2 diabetes mellitus with unspecified complications: Secondary | ICD-10-CM | POA: Diagnosis not present

## 2020-04-05 DIAGNOSIS — I10 Essential (primary) hypertension: Secondary | ICD-10-CM | POA: Diagnosis not present

## 2020-04-05 DIAGNOSIS — E785 Hyperlipidemia, unspecified: Secondary | ICD-10-CM | POA: Diagnosis not present

## 2020-04-07 DIAGNOSIS — I10 Essential (primary) hypertension: Secondary | ICD-10-CM | POA: Diagnosis not present

## 2020-04-07 DIAGNOSIS — K200 Eosinophilic esophagitis: Secondary | ICD-10-CM | POA: Diagnosis not present

## 2020-04-07 DIAGNOSIS — I2510 Atherosclerotic heart disease of native coronary artery without angina pectoris: Secondary | ICD-10-CM | POA: Diagnosis not present

## 2020-04-07 DIAGNOSIS — E118 Type 2 diabetes mellitus with unspecified complications: Secondary | ICD-10-CM | POA: Diagnosis not present

## 2020-04-07 DIAGNOSIS — E785 Hyperlipidemia, unspecified: Secondary | ICD-10-CM | POA: Diagnosis not present

## 2020-06-29 DIAGNOSIS — Z86010 Personal history of colonic polyps: Secondary | ICD-10-CM | POA: Diagnosis not present

## 2020-06-29 DIAGNOSIS — K200 Eosinophilic esophagitis: Secondary | ICD-10-CM | POA: Diagnosis not present

## 2020-06-29 DIAGNOSIS — R1310 Dysphagia, unspecified: Secondary | ICD-10-CM | POA: Diagnosis not present

## 2020-08-01 ENCOUNTER — Encounter (HOSPITAL_BASED_OUTPATIENT_CLINIC_OR_DEPARTMENT_OTHER): Payer: Self-pay | Admitting: *Deleted

## 2020-08-01 ENCOUNTER — Emergency Department (HOSPITAL_BASED_OUTPATIENT_CLINIC_OR_DEPARTMENT_OTHER)
Admission: EM | Admit: 2020-08-01 | Discharge: 2020-08-01 | Disposition: A | Discharge status: Home / Self Care | Payer: Medicare Other | Attending: Emergency Medicine | Admitting: Emergency Medicine

## 2020-08-01 ENCOUNTER — Other Ambulatory Visit: Payer: Self-pay

## 2020-08-01 DIAGNOSIS — I4892 Unspecified atrial flutter: Secondary | ICD-10-CM | POA: Diagnosis not present

## 2020-08-01 DIAGNOSIS — I10 Essential (primary) hypertension: Secondary | ICD-10-CM | POA: Diagnosis not present

## 2020-08-01 DIAGNOSIS — Z7984 Long term (current) use of oral hypoglycemic drugs: Secondary | ICD-10-CM | POA: Insufficient documentation

## 2020-08-01 DIAGNOSIS — E119 Type 2 diabetes mellitus without complications: Secondary | ICD-10-CM | POA: Insufficient documentation

## 2020-08-01 DIAGNOSIS — Z8546 Personal history of malignant neoplasm of prostate: Secondary | ICD-10-CM | POA: Diagnosis not present

## 2020-08-01 DIAGNOSIS — M542 Cervicalgia: Secondary | ICD-10-CM | POA: Diagnosis not present

## 2020-08-01 DIAGNOSIS — Z7901 Long term (current) use of anticoagulants: Secondary | ICD-10-CM | POA: Insufficient documentation

## 2020-08-01 DIAGNOSIS — R002 Palpitations: Secondary | ICD-10-CM | POA: Diagnosis present

## 2020-08-01 DIAGNOSIS — Z79899 Other long term (current) drug therapy: Secondary | ICD-10-CM | POA: Diagnosis not present

## 2020-08-01 LAB — CBC WITH DIFFERENTIAL/PLATELET
Abs Immature Granulocytes: 0.02 10*3/uL (ref 0.00–0.07)
Basophils Absolute: 0 10*3/uL (ref 0.0–0.1)
Basophils Relative: 1 %
Eosinophils Absolute: 0.1 10*3/uL (ref 0.0–0.5)
Eosinophils Relative: 1 %
HCT: 47.1 % (ref 39.0–52.0)
Hemoglobin: 16.6 g/dL (ref 13.0–17.0)
Immature Granulocytes: 0 %
Lymphocytes Relative: 22 %
Lymphs Abs: 1.4 10*3/uL (ref 0.7–4.0)
MCH: 31.7 pg (ref 26.0–34.0)
MCHC: 35.2 g/dL (ref 30.0–36.0)
MCV: 90.1 fL (ref 80.0–100.0)
Monocytes Absolute: 0.6 10*3/uL (ref 0.1–1.0)
Monocytes Relative: 9 %
Neutro Abs: 4.4 10*3/uL (ref 1.7–7.7)
Neutrophils Relative %: 67 %
Platelets: 303 10*3/uL (ref 150–400)
RBC: 5.23 MIL/uL (ref 4.22–5.81)
RDW: 12.2 % (ref 11.5–15.5)
WBC: 6.5 10*3/uL (ref 4.0–10.5)
nRBC: 0 % (ref 0.0–0.2)

## 2020-08-01 LAB — COMPREHENSIVE METABOLIC PANEL
ALT: 22 U/L (ref 0–44)
AST: 21 U/L (ref 15–41)
Albumin: 4.5 g/dL (ref 3.5–5.0)
Alkaline Phosphatase: 88 U/L (ref 38–126)
Anion gap: 9 (ref 5–15)
BUN: 21 mg/dL (ref 8–23)
CO2: 27 mmol/L (ref 22–32)
Calcium: 9.2 mg/dL (ref 8.9–10.3)
Chloride: 101 mmol/L (ref 98–111)
Creatinine, Ser: 0.96 mg/dL (ref 0.61–1.24)
GFR, Estimated: >60 mL/min (ref >60)
Glucose, Bld: 146 mg/dL — ABNORMAL HIGH (ref 70–99)
Potassium: 3.9 mmol/L (ref 3.5–5.1)
Sodium: 137 mmol/L (ref 135–145)
Total Bilirubin: 0.8 mg/dL (ref 0.3–1.2)
Total Protein: 8 g/dL (ref 6.5–8.1)

## 2020-08-01 LAB — TSH: TSH: 2.263 u[IU]/mL (ref 0.350–4.500)

## 2020-08-01 LAB — T4, FREE: Free T4: 1.13 ng/dL — ABNORMAL HIGH (ref 0.61–1.12)

## 2020-08-01 LAB — TROPONIN I (HIGH SENSITIVITY): Troponin I (High Sensitivity): 3 ng/L (ref <18)

## 2020-08-01 MED ORDER — ETOMIDATE 2 MG/ML IV SOLN
0.1000 mg/kg | Freq: Once | INTRAVENOUS | Status: AC
Start: 2020-08-01 — End: 2020-08-01
  Administered 2020-08-01: 7.48 mg via INTRAVENOUS
  Filled 2020-08-01: qty 10

## 2020-08-01 MED ORDER — SODIUM CHLORIDE 0.9 % IV BOLUS
1000.0000 mL | Freq: Once | INTRAVENOUS | Status: AC
  Administered 2020-08-01: 1000 mL via INTRAVENOUS

## 2020-08-01 MED ORDER — SODIUM CHLORIDE 0.9 % IV SOLN
INTRAVENOUS | Status: AC | PRN
  Administered 2020-08-01: 1000 mL via INTRAVENOUS

## 2020-08-01 MED ORDER — FENTANYL CITRATE (PF) 100 MCG/2ML IJ SOLN
50.0000 ug | Freq: Once | INTRAMUSCULAR | Status: AC
  Administered 2020-08-01: 50 ug via INTRAVENOUS
  Filled 2020-08-01: qty 2

## 2020-08-01 MED ORDER — FENTANYL CITRATE (PF) 100 MCG/2ML IJ SOLN
INTRAMUSCULAR | Status: AC | PRN
  Administered 2020-08-01: 50 ug via INTRAVENOUS

## 2020-08-01 MED ORDER — ETOMIDATE 2 MG/ML IV SOLN
INTRAVENOUS | Status: AC | PRN
  Administered 2020-08-01: 22.44 mg via INTRAVENOUS

## 2020-08-01 MED ORDER — APIXABAN 5 MG PO TABS: 5.0000 mg | ORAL_TABLET | Freq: Two times a day (BID) | ORAL | 0 refills | Status: DC

## 2020-08-01 NOTE — ED Notes (Signed)
ECG to be done by RN

## 2020-08-01 NOTE — Discharge Instructions (Signed)
It was a pleasure to take care of you today!  You have been given information for follow up with Cardiology, the Atrial flutter/atrial fibrillation clinic and have been given a prescription for eliquis that you need to start this evening.  Read the attached instructions, make sure to avoid NSAID medications, seek care for bleeding.  Avoid caffeine, alcohol, over the counter decongestants to help decrease episodes of atrial flutter.

## 2020-08-01 NOTE — ED Notes (Signed)
Patient given water to drink.  

## 2020-08-01 NOTE — Progress Notes (Signed)
RT in attendance for bedside cardioversion with Dr. Billy Fischer, RN x2 and NT x2. Pt placed on ETCO2 monitoring and 2L nasal cannula prior. Ambu bag and suction set up at bedside. Pt weaned to room air post procedure and tolerating well. RT will continue to monitor and be available as needed.

## 2020-08-01 NOTE — ED Provider Notes (Signed)
[facility] EMERGENCY DEPARTMENT Provider Note   CSN: [id number] Arrival date & time: 08/01/20  1253     History Chief Complaint  Patient presents with   Chest Pain    COHL BEHRENS is a 70 y.o. male.  HPI     70 year old male with a history of diabetes, hypertension, hyperlipidemia, eosinophilic esophagitis presents with concern for neck pain, palpitations and lightheadedness.  Reports he was in a normal state of health yesterday, when he initially woke up this morning felt okay, and then suddenly developed lightheadedness, followed by neck pain and palpitations.  Reports his watch showed that his heart rate was 150s.  Describes some discomfort and the left side of his neck, as well as sensation of lightheadedness.  Denies any leg pain, swelling, recent surgery, recent immobilization, shortness of breath, history of DVT or PE.  He has no known history of prior arrhythmia, reports he has had episodes of palpitations in the past which were short and self resolving, including a brief episode 3 weeks ago when he was working out in the heat, developed the symptoms, and rested and it improved.  Denies any history of GI bleed, although he reports he has been referred for possible esophageal dilation.  Denies any recent illness or acute medication changes.  Past Medical History:  Diagnosis Date   Diabetes mellitus without complication ([place])    Esophageal stricture    Family history of heart disease    Hypertension    Nephrolithiasis    Prostate cancer [facility])     Patient Active Problem List   Diagnosis Date Noted   Hyperlipidemia 10/14/2019   Adhesive capsulitis of right shoulder 05/02/2015   Essential hypertension 07/24/2013   Type II or unspecified type diabetes mellitus with ketoacidosis, not stated as uncontrolled 01/08/2013   Adhesive capsulitis of left shoulder 10/16/2012    Past Surgical History:  Procedure Laterality Date   PROSTATECTOMY     TONSILLECTOMY      VARICOCELECTOMY         Family History  Problem Relation Age of Onset   Heart disease Mother    ALS Father    Cancer Sister     Social History   Tobacco Use   Smoking status: Never   Smokeless tobacco: Never  Vaping Use   Vaping Use: Never used  Substance Use Topics   Alcohol use: Not Currently   Drug use: No    Home Medications Prior to Admission medications   Medication Sig Start Date End Date Taking? Authorizing Provider  apixaban (ELIQUIS) 5 MG TABS tablet Take 1 tablet (5 mg total) by mouth 2 (two) times daily. 08/01/20 08/31/20 Yes Gareth Morgan, MD  metFORMIN (GLUCOPHAGE) 500 MG tablet Take 500 mg by mouth daily. 08/28/19   [provider]  olmesartan-hydrochlorothiazide (BENICAR HCT) 20-12.5 MG tablet Take 1 tablet by mouth daily.    [provider]  ONE TOUCH ULTRA TEST test strip  08/04/13   [provider]  rosuvastatin (CRESTOR) 20 MG tablet Take 20 mg by mouth daily.    [provider]  Semaglutide (OZEMPIC, 0.25 OR 0.5 MG/DOSE, [place]) Inject into the skin.    [provider]    Allergies    Aspirin  Review of Systems   Review of Systems  Constitutional:  Negative for fever.  Eyes:  Negative for visual disturbance.  Respiratory:  Negative for cough and shortness of breath.   Cardiovascular:  Positive for palpitations. Negative for chest pain.  Gastrointestinal:  Negative for abdominal pain, nausea and vomiting.  Genitourinary:  Negative for difficulty urinating.  Musculoskeletal:  Positive for neck pain.  Skin:  Negative for rash.  Neurological:  Positive for light-headedness. Negative for syncope.   Physical Exam Updated Vital Signs BP 115/80 (BP Location: Left Arm)   Pulse 100   Temp 98.2 F (36.8 C) (Oral)   Resp 16   Ht 5\' 9"  (1.753 m)   Wt 74.8 kg   SpO2 100%   BMI 24.37 kg/m   Physical Exam Vitals and nursing note reviewed.  Constitutional:      General: He is not in acute distress.     Appearance: He is well-developed. He is not diaphoretic.  HENT:     Head: Normocephalic and atraumatic.  Eyes:     Conjunctiva/sclera: Conjunctivae normal.  Cardiovascular:     Rate and Rhythm: Regular rhythm. Tachycardia present.     Heart sounds: Normal heart sounds. No murmur heard.   No friction rub. No gallop.  Pulmonary:     Effort: Pulmonary effort is normal. No respiratory distress.     Breath sounds: Normal breath sounds. No wheezing or rales.  Abdominal:     General: There is no distension.     Palpations: Abdomen is soft.     Tenderness: There is no abdominal tenderness. There is no guarding.  Musculoskeletal:     Cervical back: Normal range of motion.  Skin:    General: Skin is warm and dry.  Neurological:     Mental Status: He is alert and oriented to person, place, and time.    ED Results / Procedures / Treatments   Labs (all labs ordered are listed, but only abnormal results are displayed) Labs Reviewed  COMPREHENSIVE METABOLIC PANEL - Abnormal; Notable for the following components:      Result Value   Glucose, Bld 146 (*)    All other components within normal limits  CBC WITH DIFFERENTIAL/PLATELET  TSH  T4, FREE  TROPONIN I (HIGH SENSITIVITY)    EKG EKG Interpretation  Date/Time:  Monday August 01 2020 13:03:18 EDT Ventricular Rate:  152 PR Interval:  128 QRS Duration: 82 QT Interval:  310 QTC Calculation: 492 R Axis:   61 Text Interpretation: Suspect atrial flutter with RVR Marked ST abnormality, possible inferior subendocardial injury Abnormal ECG No previous ECGs available Confirmed by Gareth Morgan [phone]) on 08/01/2020 1:12:00 PM  Radiology No results found.  Procedures .Critical Care  Date/Time: 08/01/2020 4:04 PM Performed by: Gareth Morgan, MD Authorized by: Gareth Morgan, MD   Critical care provider statement:    Critical care time (minutes):  30   Critical care was time spent personally by me on the following activities:   Discussions with consultants, evaluation of patient's response to treatment, examination of patient, ordering and performing treatments and interventions, ordering and review of laboratory studies, ordering and review of radiographic studies, pulse oximetry, re-evaluation of patient's condition, obtaining history from patient or surrogate and review of old charts .Cardioversion  Date/Time: 08/01/2020 4:04 PM Performed by: Gareth Morgan, MD Authorized by: Gareth Morgan, MD   Consent:    Consent obtained:  Verbal   Consent given by:  Patient   Risks discussed:  Cutaneous burn, death, induced arrhythmia and pain   Alternatives discussed:  Rate-control medication Pre-procedure details:    Cardioversion basis:  Elective   Rhythm:  Atrial flutter   Electrode placement:  Anterior-posterior Attempt one:    Cardioversion mode:  Synchronous  Shock (Joules):  120   Shock outcome:  Conversion to normal sinus rhythm Post-procedure details:    Patient status:  Alert   Patient tolerance of procedure:  Tolerated well, no immediate complications .Sedation  Date/Time: 08/01/2020 4:05 PM Performed by: Gareth Morgan, MD Authorized by: Gareth Morgan, MD   Consent:    Consent obtained:  Verbal   Consent given by:  Patient   Risks discussed:  Allergic reaction, dysrhythmia, inadequate sedation, nausea, prolonged hypoxia resulting in organ damage, prolonged sedation necessitating reversal, respiratory compromise necessitating ventilatory assistance and intubation and vomiting   Alternatives discussed:  Analgesia without sedation Universal protocol:    Immediately prior to procedure, a time out was called: yes   Indications:    Procedure performed:  Cardioversion   Procedure necessitating sedation performed by:  Physician performing sedation Pre-sedation assessment:    Time since last food or drink:  2   ASA classification: class 2 - patient with mild systemic disease     Mallampati score:   I - soft palate, uvula, fauces, pillars visible   Pre-sedation assessments completed and reviewed: pre-procedure airway patency not reviewed, pre-procedure cardiovascular function not reviewed, pre-procedure mental status not reviewed, pre-procedure respiratory function not reviewed and pre-procedure temperature not reviewed   Immediate pre-procedure details:    Reassessment: Patient reassessed immediately prior to procedure     Reviewed: vital signs     Verified: bag valve mask available, emergency equipment available, intubation equipment available, IV patency confirmed and oxygen available   Procedure details (see MAR for exact dosages):    Preoxygenation:  Nasal cannula   Sedation:  Etomidate   Intended level of sedation: deep   Analgesia:  Fentanyl   Intra-procedure monitoring:  Blood pressure monitoring, cardiac monitor, continuous capnometry, continuous pulse oximetry, frequent LOC assessments and frequent vital sign checks   Intra-procedure events: none     Total Provider sedation time (minutes):  20 Post-procedure details:    Recovery: Patient returned to pre-procedure baseline     Post-sedation assessments completed and reviewed: airway patency, cardiovascular function, hydration status, mental status, pain level, respiratory function and temperature     Patient is stable for discharge or admission: yes     Procedure completion:  Tolerated well, no immediate complications   Medications Ordered in ED Medications  etomidate (AMIDATE) injection 7.48 mg (7.48 mg Intravenous Given 08/01/20 1426)  fentaNYL (SUBLIMAZE) injection 50 mcg (50 mcg Intravenous Given 08/01/20 1426)  sodium chloride 0.9 % bolus 1,000 mL (0 mLs Intravenous Stopped 08/01/20 1532)  fentaNYL (SUBLIMAZE) injection (50 mcg Intravenous Given 08/01/20 1426)  etomidate (AMIDATE) injection (22.44 mg Intravenous Given 08/01/20 1426)  0.9 %  sodium chloride infusion ( Intravenous Stopped 08/01/20 1433)          ED Course  I  have reviewed the triage vital signs and the nursing notes.  Pertinent labs & imaging results that were available during my care of the patient were reviewed by me and considered in my medical decision making (see chart for details).    MDM Rules/Calculators/A&P                           70 year old male with a history of diabetes, hypertension, hyperlipidemia, eosinophilic esophagitis presents with concern for neck pain, palpitations and lightheadedness.  EKG on arrival shows atrial flutter with RVR, and diffuse ischemic changes which are likely rate related.  No significant anemia or electrolyte abnormalities.  Troponin is 3, and  in setting of significant tachycardia and duration of symptoms prior to arrival, feel this is reassuring and have low suspicion for ACS .  No risk factors for PE< no dyspnea, no hypoxia, and has noted other similar episodes which have not been diagnosed in the past and have low suspicion for PE. Symptoms likely secondary to atrial flutter with RVR.    Discussed with Dr. Harrington Challenger of Cardiology who reviewed ECG and is in agreement. Given symptoms began this morning, feel he is a candidate for cardioversion and discussed this with Cardiology and Mr Flenner. Discussed options including rate control or cardioversion and after discussion reviewed risks/benefits of cardioversion and sedation and proceeded with procedure with his consent.  Cardioverted to sinus rhythm under etomidate sedation as above. Has remained in sinus rhythm for one hour following sedation and has returned to baseline and is tolerating po.  Repeat EKG shows sinus rhythm with resolution of prior ST changes, however was unable to be uploaded into MUSE and is pictured above.  His CHADS2VASc score 3. Discussed recommendation for anticoagulation for stroke prevention and risks.  Discussed possibility of initiation of low-dose rate control, however given current blood pressures, with brief episode, feels reasonable to do  continued expectant management and avoidance of triggers.  Recommend follow up with atrial flutter clinic.  TSH pending and may be reviewed at that time.  Patient discharged in stable condition with understanding of reasons to return.     Final Clinical Impression(s) / ED Diagnoses Final diagnoses:  Atrial flutter with rapid ventricular response ([place])    Rx / DC Orders ED Discharge Orders          Ordered    apixaban (ELIQUIS) 5 MG TABS tablet  2 times daily        08/01/20 1525             Gareth Morgan, MD 08/01/20 1616

## 2020-08-01 NOTE — ED Notes (Signed)
Pt taken directly to room 14 by emt. Charge nurse aware.

## 2020-08-01 NOTE — ED Notes (Signed)
Patient denies chest pain, reports rapid heart rate, dizziness, lightheadedness, pain in back of neck.

## 2020-08-01 NOTE — ED Triage Notes (Signed)
Pt reports feeling "light headed" and feeling like his heart is beating "really fast" x 1 hour. Denies sob, chest pain. States he has some pain at the posterior base of his neck, rates at 1/10.  Ekg performed at triage and given to dr. Billy Fischer by D[place] Brick emt.

## 2020-08-08 ENCOUNTER — Other Ambulatory Visit: Payer: Self-pay

## 2020-08-08 ENCOUNTER — Ambulatory Visit (HOSPITAL_COMMUNITY)
Admission: RE | Admit: 2020-08-08 | Discharge: 2020-08-08 | Disposition: A | Discharge status: Home / Self Care | Payer: Medicare Other | Source: Ambulatory Visit | Attending: Physician Assistant | Admitting: Physician Assistant

## 2020-08-08 ENCOUNTER — Encounter (HOSPITAL_COMMUNITY): Payer: Self-pay | Admitting: Physician Assistant

## 2020-08-08 VITALS — BP 112/60 | HR 84 | Ht 69.0 in | Wt 155.6 lb

## 2020-08-08 DIAGNOSIS — I483 Typical atrial flutter: Secondary | ICD-10-CM

## 2020-08-08 DIAGNOSIS — Z7951 Long term (current) use of inhaled steroids: Secondary | ICD-10-CM | POA: Diagnosis not present

## 2020-08-08 DIAGNOSIS — Z886 Allergy status to analgesic agent status: Secondary | ICD-10-CM | POA: Diagnosis not present

## 2020-08-08 DIAGNOSIS — Z7901 Long term (current) use of anticoagulants: Secondary | ICD-10-CM | POA: Insufficient documentation

## 2020-08-08 DIAGNOSIS — Z8249 Family history of ischemic heart disease and other diseases of the circulatory system: Secondary | ICD-10-CM | POA: Diagnosis not present

## 2020-08-08 DIAGNOSIS — Z79899 Other long term (current) drug therapy: Secondary | ICD-10-CM | POA: Diagnosis not present

## 2020-08-08 DIAGNOSIS — I10 Essential (primary) hypertension: Secondary | ICD-10-CM | POA: Diagnosis not present

## 2020-08-08 DIAGNOSIS — D6869 Other thrombophilia: Secondary | ICD-10-CM | POA: Insufficient documentation

## 2020-08-08 MED ORDER — APIXABAN 5 MG PO TABS: 5.0000 mg | ORAL_TABLET | Freq: Two times a day (BID) | ORAL | 3 refills | Status: DC

## 2020-08-08 NOTE — Progress Notes (Signed)
Primary Care Physician: Deland Pretty, MD Primary Cardiologist: Dr Gwenlyn Found Primary Electrophysiologist: none Referring Physician: [facility]   James Booth is a 70 y.o. male with a history of HTN, DM, atrial flutter who presents for consultation in the [facility].  The patient was initially diagnosed with atrial flutter 08/01/20 after presenting to the ED with palpitations and lightheadedness. He underwent DCCV at that time. Patient was started on Eliquis for a CHADS2VASC score of 3. He has done well since then with no further heart racing. He denies any bleeding issues on anticoagulation. He denies significant snoring or alcohol use.   Today, he denies symptoms of palpitations, chest pain, shortness of breath, orthopnea, PND, lower extremity edema, dizziness, presyncope, syncope, snoring, daytime somnolence, bleeding, or neurologic sequela. The patient is tolerating medications without difficulties and is otherwise without complaint today.    Atrial Fibrillation Risk Factors:  he does not have symptoms or diagnosis of sleep apnea. he does not have a history of rheumatic fever. he does not have a history of alcohol use. The patient does have a history of early familial atrial fibrillation or other arrhythmias. Son has afib.  he has a BMI of Body mass index is 22.98 kg/m.Marland Kitchen Filed Weights   08/08/20 1510  Weight: 70.6 kg    Family History  Problem Relation Age of Onset   Heart disease Mother    ALS Father    Cancer Sister      Atrial Fibrillation Management history:  Previous antiarrhythmic drugs: none Previous cardioversions: 08/01/20 Previous ablations: none CHADS2VASC score: 3 Anticoagulation history: Eliquis   Past Medical History:  Diagnosis Date   Diabetes mellitus without complication ([place])    Esophageal stricture    Family history of heart disease    Hypertension    Nephrolithiasis    Prostate cancer ([place])    Past Surgical  History:  Procedure Laterality Date   PROSTATECTOMY     TONSILLECTOMY     VARICOCELECTOMY      Current Outpatient Medications  Medication Sig Dispense Refill   acetaminophen (TYLENOL) 650 MG CR tablet Take 650 mg by mouth every 8 (eight) hours as needed for pain.     apixaban (ELIQUIS) 5 MG TABS tablet Take 1 tablet (5 mg total) by mouth 2 (two) times daily. 60 tablet 0   FLOVENT HFA 220 MCG/ACT inhaler Inhale 2 puffs into the lungs 2 (two) times daily.     metFORMIN (GLUCOPHAGE) 500 MG tablet Take 500 mg by mouth daily.     olmesartan (BENICAR) 20 MG tablet Take 20 mg by mouth daily.     ONE TOUCH ULTRA TEST test strip      OZEMPIC, 1 MG/DOSE, 4 MG/3ML SOPN Inject into the skin.     rosuvastatin (CRESTOR) 20 MG tablet Take 20 mg by mouth daily.     No current facility-administered medications for this encounter.    Allergies  Allergen Reactions   Aspirin Nausea Only    Acid reflux    Social History   Socioeconomic History   Marital status: Divorced    Spouse name: Not on file   Number of children: 3   Years of education: BS    Highest education level: Not on file  Occupational History   Not on file  Tobacco Use   Smoking status: Never   Smokeless tobacco: Never  Vaping Use   Vaping Use: Never used  Substance and Sexual Activity   Alcohol use:  Not Currently   Drug use: No   Sexual activity: Not on file  Other Topics Concern   Not on file  Social History Narrative   Patient has 2 sons and 1 daughter.    Patient is living with girlfriend. James Booth    Patient is divorced.    Patient has a B.S    Patient is right handed.    Patient is working at [employer].            Social Determinants of Health   Financial Resource Strain: Not on file  Food Insecurity: Not on file  Transportation Needs: Not on file  Physical Activity: Not on file  Stress: Not on file  Social Connections: Not on file  Intimate Partner Violence: Not on file     ROS- All systems are  reviewed and negative except as per the HPI above.  Physical Exam: Vitals:   08/08/20 1510  BP: 112/60  Pulse: 84  Weight: 70.6 kg  Height: 5\' 9"  (1.753 m)    GEN- The patient is a well appearing male, alert and oriented x 3 today.   Head- normocephalic, atraumatic Eyes-  Sclera clear, conjunctiva pink Ears- hearing intact Oropharynx- clear Neck- supple  Lungs- Clear to ausculation bilaterally, normal work of breathing Heart- Regular rate and rhythm, no murmurs, rubs or gallops  GI- soft, NT, ND, + BS Extremities- no clubbing, cyanosis, or edema MS- no significant deformity or atrophy Skin- no rash or lesion Psych- euthymic mood, full affect Neuro- strength and sensation are intact  Wt Readings from Last 3 Encounters:  08/08/20 70.6 kg  08/01/20 74.8 kg  10/14/19 75.3 kg    EKG today demonstrates  SR Vent. rate 84 BPM PR interval 190 ms QRS duration 92 ms QT/QTcB 352/415 ms  Echo 05/19/08 demonstrated  EF 55%, no significant valve issues, normal LA size  Epic records are reviewed at length today  CHA2DS2-VASc Score = 3  The patient's score is based upon: CHF History: No HTN History: Yes Diabetes History: Yes Stroke History: No Vascular Disease History: No Age Score: 1 Gender Score: 0     ASSESSMENT AND PLAN: 1. Typical atrial flutter The patient's CHA2DS2-VASc score is 3, indicating a 3.2% annual risk of stroke.   General education about atrial flutter provided and questions answered. We also discussed his stroke risk and the risks and benefits of anticoagulation. We discussed therapeutic options including ablation. He would like to hold on ablation for now given the need for an esophegeal dilitation at [facility] soon. We discussed that he would not be able to hold anticoagulation for 4 weeks post DCCV. Patient voices understanding. Continue Eliquis 5 mg BID Check echocardiogram   2. Secondary Hypercoagulable State (ICD10:  D68.69) The patient is at  significant risk for stroke/thromboembolism based upon his CHA2DS2-VASc Score of 3.  Continue Apixaban (Eliquis).   3. HTN Stable, no changes today.   Follow up in the AF clinic in 2 months.    [facility] [address] [place],  [address] [phone] 08/08/2020 3:50 PM

## 2020-08-10 ENCOUNTER — Other Ambulatory Visit: Payer: Self-pay

## 2020-08-10 ENCOUNTER — Encounter (HOSPITAL_COMMUNITY): Payer: Self-pay | Admitting: *Deleted

## 2020-08-10 ENCOUNTER — Ambulatory Visit (HOSPITAL_COMMUNITY)
Admission: RE | Admit: 2020-08-10 | Discharge: 2020-08-10 | Disposition: A | Discharge status: Home / Self Care | Payer: Medicare Other | Source: Ambulatory Visit | Attending: Physician Assistant | Admitting: Physician Assistant

## 2020-08-10 DIAGNOSIS — I700 Atherosclerosis of aorta: Secondary | ICD-10-CM | POA: Diagnosis not present

## 2020-08-10 DIAGNOSIS — I4891 Unspecified atrial fibrillation: Secondary | ICD-10-CM | POA: Diagnosis not present

## 2020-08-10 DIAGNOSIS — E119 Type 2 diabetes mellitus without complications: Secondary | ICD-10-CM | POA: Diagnosis not present

## 2020-08-10 DIAGNOSIS — I10 Essential (primary) hypertension: Secondary | ICD-10-CM | POA: Insufficient documentation

## 2020-08-10 DIAGNOSIS — E785 Hyperlipidemia, unspecified: Secondary | ICD-10-CM | POA: Diagnosis not present

## 2020-08-10 DIAGNOSIS — I483 Typical atrial flutter: Secondary | ICD-10-CM

## 2020-08-10 LAB — ECHOCARDIOGRAM COMPLETE
Area-P 1/2: 3.3 cm2
S' Lateral: 2.9 cm

## 2020-08-10 NOTE — Progress Notes (Signed)
Echocardiogram 2D Echocardiogram has been performed.  Oneal Deputy Nicholette Dolson RDCS 08/10/2020, 3:50 PM

## 2020-08-29 DIAGNOSIS — E785 Hyperlipidemia, unspecified: Secondary | ICD-10-CM | POA: Diagnosis not present

## 2020-08-29 DIAGNOSIS — I10 Essential (primary) hypertension: Secondary | ICD-10-CM | POA: Diagnosis not present

## 2020-08-29 DIAGNOSIS — E119 Type 2 diabetes mellitus without complications: Secondary | ICD-10-CM | POA: Diagnosis not present

## 2020-08-29 DIAGNOSIS — Z125 Encounter for screening for malignant neoplasm of prostate: Secondary | ICD-10-CM | POA: Diagnosis not present

## 2020-09-05 DIAGNOSIS — E118 Type 2 diabetes mellitus with unspecified complications: Secondary | ICD-10-CM | POA: Diagnosis not present

## 2020-09-05 DIAGNOSIS — I10 Essential (primary) hypertension: Secondary | ICD-10-CM | POA: Diagnosis not present

## 2020-09-05 DIAGNOSIS — K200 Eosinophilic esophagitis: Secondary | ICD-10-CM | POA: Diagnosis not present

## 2020-09-05 DIAGNOSIS — M7062 Trochanteric bursitis, left hip: Secondary | ICD-10-CM | POA: Diagnosis not present

## 2020-09-05 DIAGNOSIS — J309 Allergic rhinitis, unspecified: Secondary | ICD-10-CM | POA: Diagnosis not present

## 2020-09-05 DIAGNOSIS — I4892 Unspecified atrial flutter: Secondary | ICD-10-CM | POA: Diagnosis not present

## 2020-09-05 DIAGNOSIS — I2510 Atherosclerotic heart disease of native coronary artery without angina pectoris: Secondary | ICD-10-CM | POA: Diagnosis not present

## 2020-09-05 DIAGNOSIS — R319 Hematuria, unspecified: Secondary | ICD-10-CM | POA: Diagnosis not present

## 2020-09-05 DIAGNOSIS — I2584 Coronary atherosclerosis due to calcified coronary lesion: Secondary | ICD-10-CM | POA: Diagnosis not present

## 2020-09-05 DIAGNOSIS — D6869 Other thrombophilia: Secondary | ICD-10-CM | POA: Diagnosis not present

## 2020-09-05 DIAGNOSIS — Z0000 Encounter for general adult medical examination without abnormal findings: Secondary | ICD-10-CM | POA: Diagnosis not present

## 2020-09-05 DIAGNOSIS — Z8546 Personal history of malignant neoplasm of prostate: Secondary | ICD-10-CM | POA: Diagnosis not present

## 2020-09-19 DIAGNOSIS — R1310 Dysphagia, unspecified: Secondary | ICD-10-CM | POA: Diagnosis not present

## 2020-09-19 DIAGNOSIS — K200 Eosinophilic esophagitis: Secondary | ICD-10-CM | POA: Diagnosis not present

## 2020-09-27 IMAGING — RF ESOPHAGUS/BARIUM SWALLOW/TABLET STUDY
8 series · 14 of 24 positions shown · non-contrast
Comparison: None.

CLINICAL DATA: Food getting stuck in throat.

EXAM:
ESOPHOGRAM / BARIUM SWALLOW / BARIUM TABLET STUDY
TECHNIQUE: Combined double contrast and single contrast examination performed
using effervescent crystals, thick barium liquid, and thin barium
liquid. The patient was observed with fluoroscopy swallowing a 13 mm
barium sulphate tablet.
FLUOROSCOPY TIME:  Fluoroscopy Time:  3 minutes and 24 seconds.
Radiation Exposure Index (if provided by the fluoroscopic device):
160 mGy
Number of Acquired Spot Images:

[Series 1: sequence · 0.28mm/px · 2 of 22 frames shown (1 of 7)]
[frame 4/22]
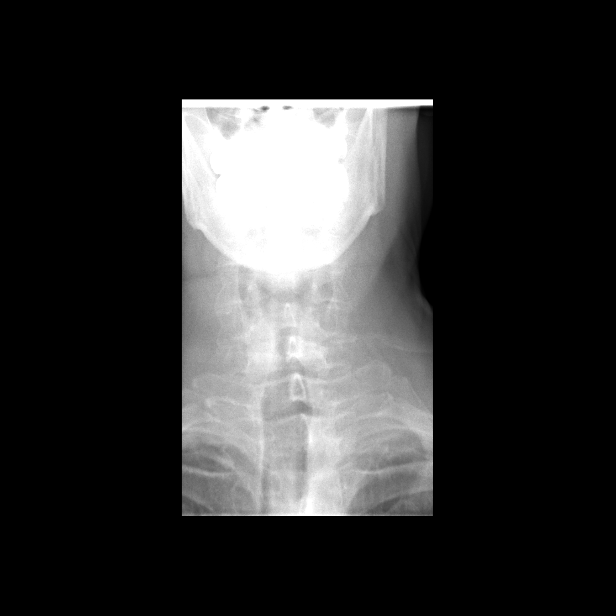
[frame 12/22]
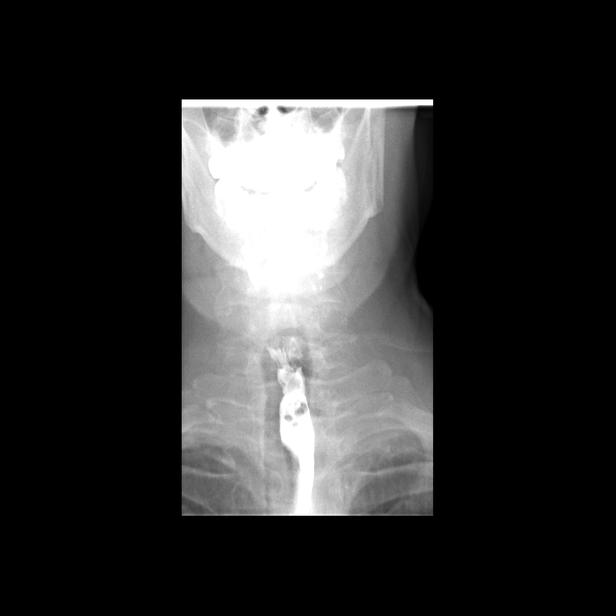

[Series 2: sequence · 0.28mm/px · 2 of 16 frames shown (2 of 7)]
[frame 9/16]
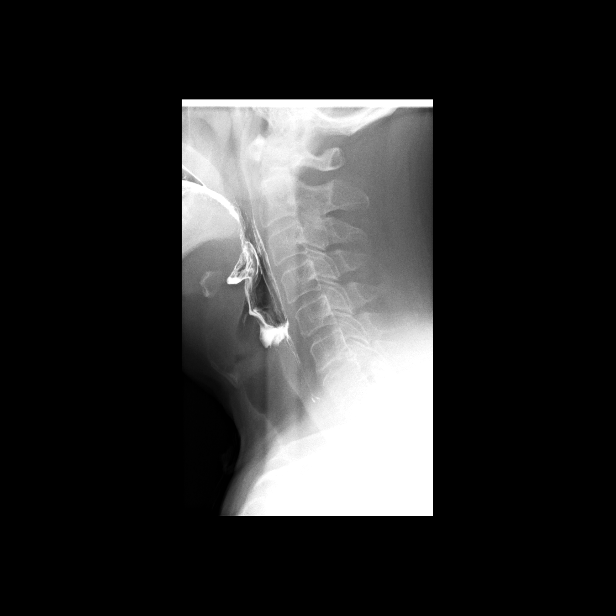
[frame 14/16]
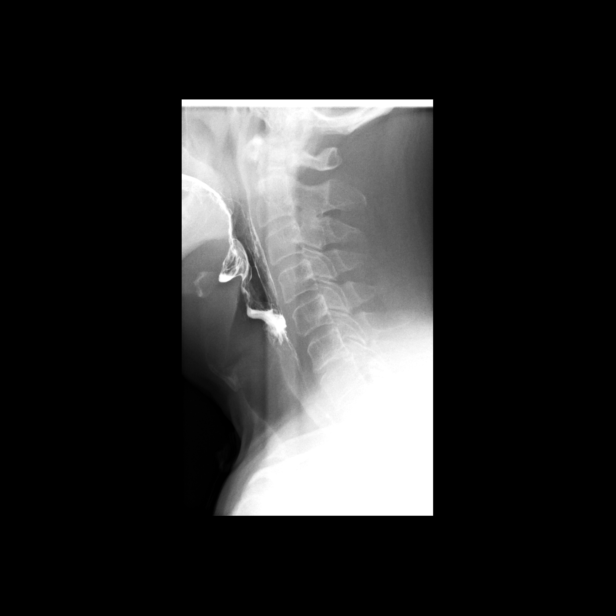

[Series 3: sequence · 0.28mm/px · 2 of 39 frames shown (3 of 7)]
[frame 20/39]
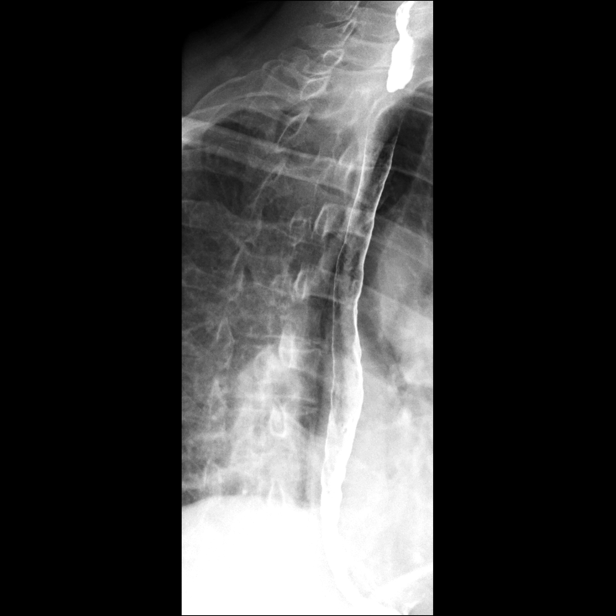
[frame 38/39]
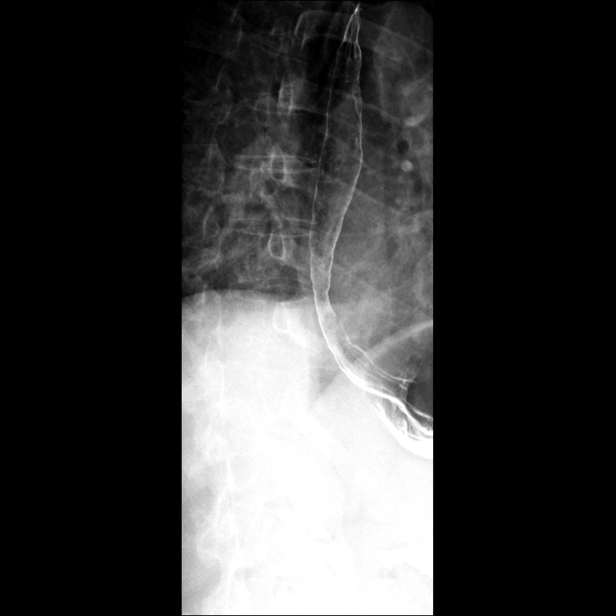

[Series 4: sequence · 0.28mm/px · 1 of 8 frames shown (4 of 7)]
[frame 5/8]
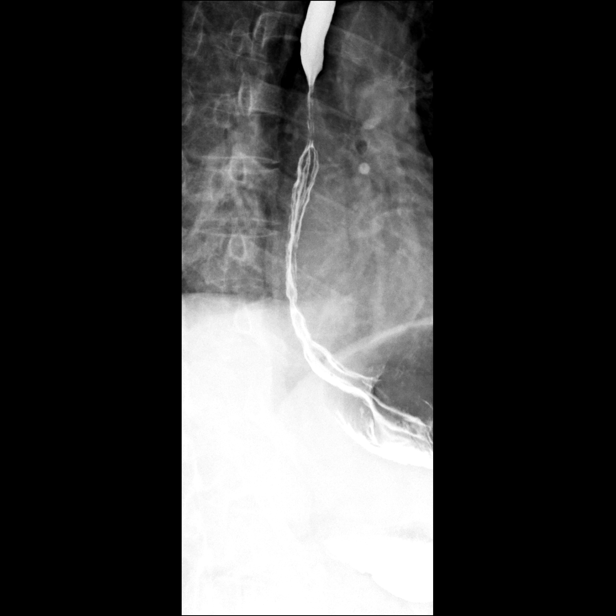

[Series 5: sequence · 2 of 33 frames shown (5 of 7)]
[frame 5/33]
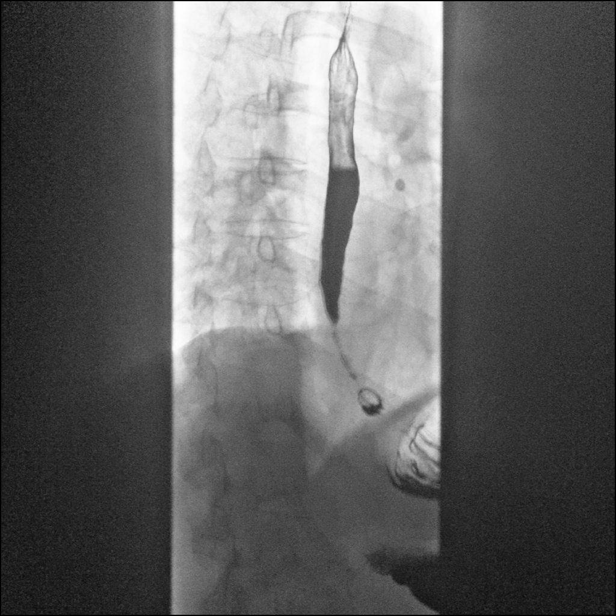
[frame 18/33]
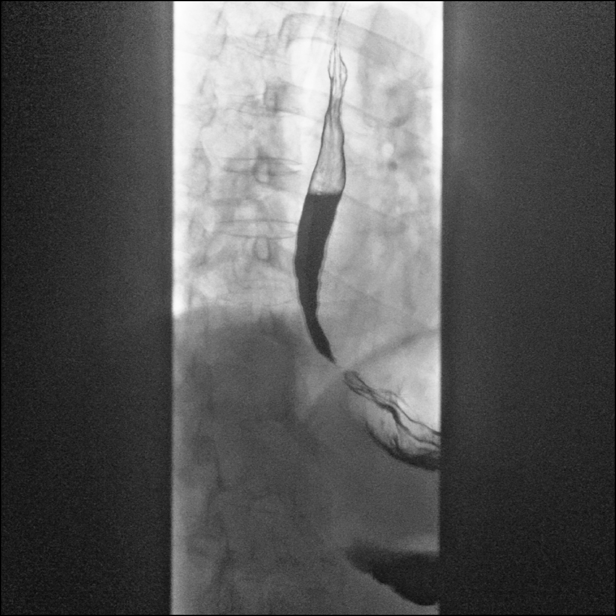

[Series 6: sequence · 2 of 137 frames shown (6 of 7)]
[frame 21/137]
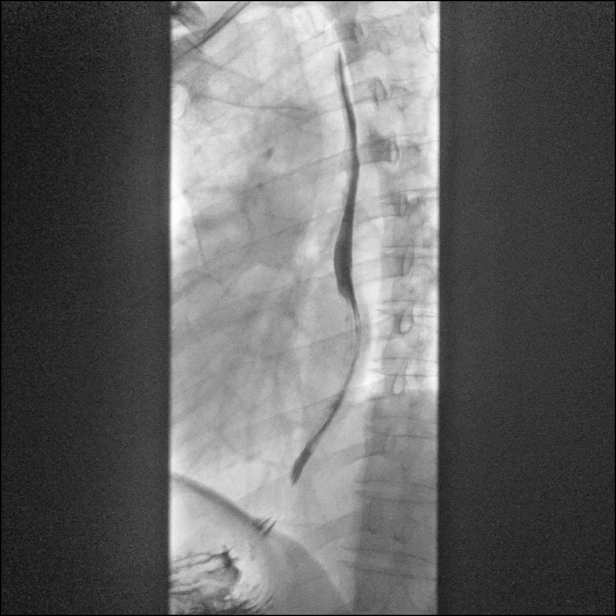
[frame 117/137]
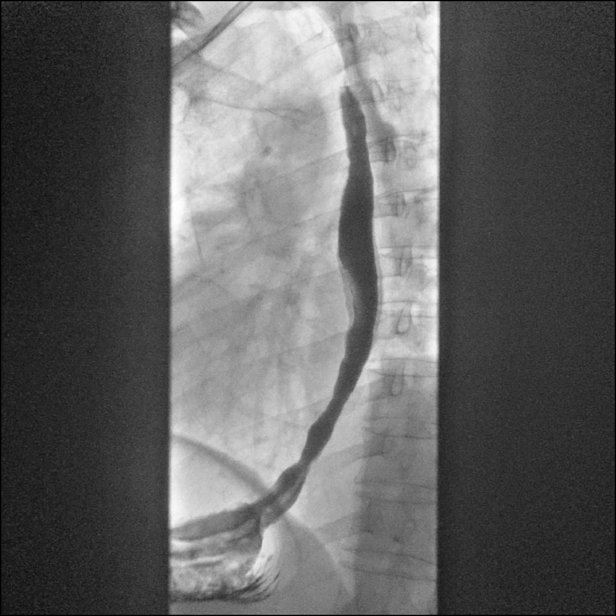

[Series 7: sequence · 2 of 66 frames shown (7 of 7)]
[frame 8/66]
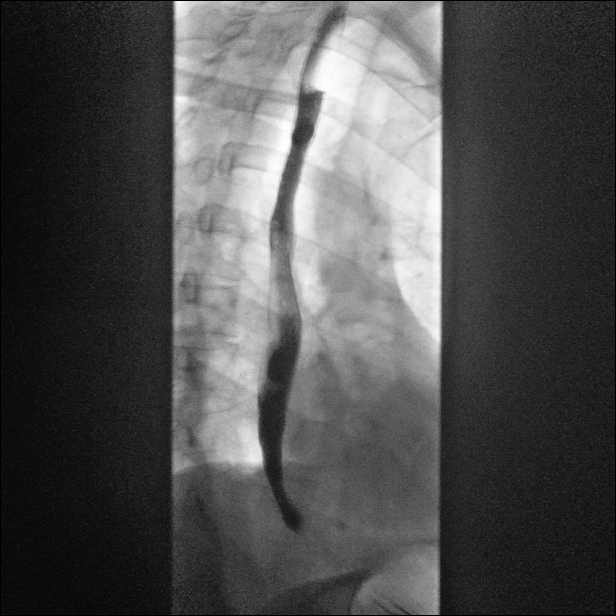
[frame 57/66]
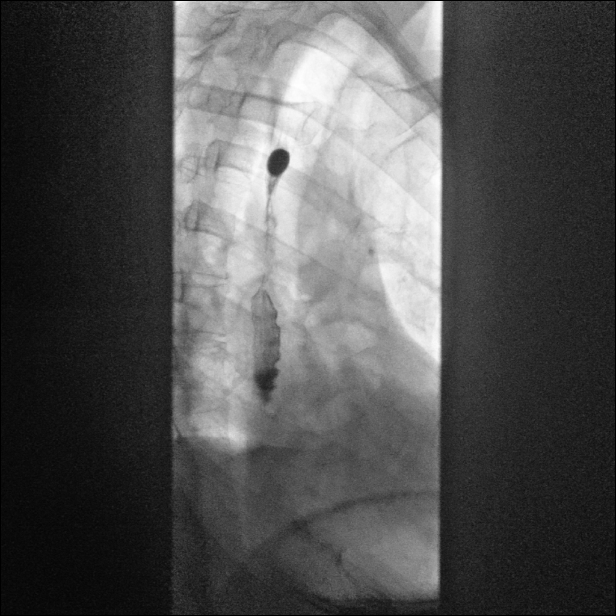

[Series 8: one shot · 1 of 2 slices shown]
[im 2/2]
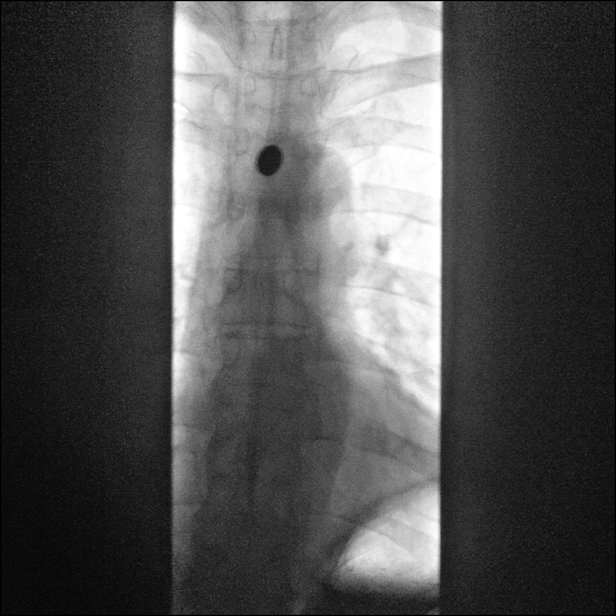

[14 of 24 positions shown; findings below may reference images not displayed]

FINDINGS: Frontal and lateral views of the hypopharynx while swallowing barium
are unremarkable.

Double contrast imaging of the esophagus is limited by patient
reluctance to drink barium vigorously. As such, full esophageal
distention was not obtained on single or double contrast imaging.
Overall, the esophagus has a diffusely narrowed appearance. The
distal esophagus does not fully distend on any imaging and appears
to represent a relatively long gradually tapering narrowing just
proximal to the esophagogastric junction. Small hiatal hernia
evident.

Evaluation of esophageal motility shows normal primary peristalsis
mild tertiary contractions noted.

Patient was given a 13 mm barium tablet which became lodged in the
proximal esophagus, at approximately the level of the sternal notch.
The tablet would not advance distally despite multiple repeated
swallows of water and thin barium. Patient had no sensation of the
tablet being lodged at this location.
IMPRESSION: 1. Apparent diffuse narrowing of the esophagus although assessment
is limited by patient reluctance to vigorously drink barium to
achieve esophageal distention. Imaging features raise the question
of the eosinophilic esophagitis although there are no characteristic
concentric rings in the esophagus typically seen with this etiology.
2. 13 mm barium tablet becomes lodged in the proximal third of the
esophagus, at approximately the level of the sternal notch. The
tablet does not advance despite multiple repeated swallows of water
and thin barium. Patient has no sensation of the tablet being lodged
at this location.
3. Relatively long segment of gradual narrowing in the distal
esophagus just proximal to the esophagogastric junction. This area
was poorly distended on all views and the tablet did not reach this
location.
4. Small hiatal hernia.

## 2020-10-06 ENCOUNTER — Telehealth: Payer: Self-pay

## 2020-10-06 NOTE — Telephone Encounter (Signed)
   [facility] Pre-operative Risk Assessment    Patient Name: James Booth  DOB: May 06, 1950 MRN: [id number]  HEARTCARE STAFF:  - IMPORTANT!!!!!! Under Visit Info/Reason for Call, type in Other and utilize the format Clearance MM/DD/YY or Clearance TBD. Do not use dashes or single digits. - Please review there is not already an duplicate clearance open for this procedure. - If request is for dental extraction, please clarify the # of teeth to be extracted. - If the patient is currently at the dentist's office, call Pre-Op Callback Staff (MA/nurse) to input urgent request.  - If the patient is not currently in the dentist office, please route to the Pre-Op pool.  Request for surgical clearance:  What type of surgery is being performed? EGD  When is this surgery scheduled? TBD   What type of clearance is required (medical clearance vs. Pharmacy clearance to hold med vs. Both)? Both   Are there any medications that need to be held prior to surgery and how long? Eliquis   Practice name and name of physician performing surgery? [facility],   What is the office phone number? [phone]   7.   What is the office fax number? [phone] Attn: Triage Nurse   8.   Anesthesia type (None, local, MAC, general) ?    Jacqulynn Cadet 10/06/2020, 12:25 PM  _________________________________________________________________   (provider comments below)

## 2020-10-07 NOTE — Telephone Encounter (Signed)
Patient with diagnosis of aflutter on Eliquis for anticoagulation.    Procedure: EGD Date of procedure: TBD  CHA2DS2-VASc Score = 3  This indicates a 3.2% annual risk of stroke. The patient's score is based upon: CHF History: 0 HTN History: 1 Diabetes History: 1 Stroke History: 0 Vascular Disease History: 0 Age Score: 1 Gender Score: 0  Underwent cardioversion on 08/01/20.  CrCl 57m/min Platelet count 303K  Per office protocol, patient can hold Eliquis for 1-2 days prior to procedure.

## 2020-10-07 NOTE — Telephone Encounter (Signed)
Patient is scheduled to see [facility] fib clinic on 9/14, cardiac clearance can be addressed at that time.

## 2020-10-13 ENCOUNTER — Encounter (HOSPITAL_COMMUNITY): Payer: Self-pay | Admitting: Physician Assistant

## 2020-10-13 ENCOUNTER — Ambulatory Visit (HOSPITAL_COMMUNITY)
Admission: RE | Admit: 2020-10-13 | Discharge: 2020-10-13 | Disposition: A | Discharge status: Home / Self Care | Payer: Medicare Other | Source: Ambulatory Visit | Attending: Physician Assistant | Admitting: Physician Assistant

## 2020-10-13 ENCOUNTER — Other Ambulatory Visit: Payer: Self-pay

## 2020-10-13 VITALS — BP 120/82 | HR 72 | Ht 69.0 in | Wt 154.6 lb

## 2020-10-13 DIAGNOSIS — I483 Typical atrial flutter: Secondary | ICD-10-CM | POA: Insufficient documentation

## 2020-10-13 DIAGNOSIS — Z7901 Long term (current) use of anticoagulants: Secondary | ICD-10-CM | POA: Diagnosis not present

## 2020-10-13 DIAGNOSIS — I10 Essential (primary) hypertension: Secondary | ICD-10-CM | POA: Diagnosis not present

## 2020-10-13 DIAGNOSIS — D6859 Other primary thrombophilia: Secondary | ICD-10-CM | POA: Insufficient documentation

## 2020-10-13 DIAGNOSIS — Z7984 Long term (current) use of oral hypoglycemic drugs: Secondary | ICD-10-CM | POA: Diagnosis not present

## 2020-10-13 DIAGNOSIS — Z79899 Other long term (current) drug therapy: Secondary | ICD-10-CM | POA: Insufficient documentation

## 2020-10-13 DIAGNOSIS — E119 Type 2 diabetes mellitus without complications: Secondary | ICD-10-CM | POA: Insufficient documentation

## 2020-10-13 DIAGNOSIS — Z8249 Family history of ischemic heart disease and other diseases of the circulatory system: Secondary | ICD-10-CM | POA: Diagnosis not present

## 2020-10-13 DIAGNOSIS — Z886 Allergy status to analgesic agent status: Secondary | ICD-10-CM | POA: Diagnosis not present

## 2020-10-13 DIAGNOSIS — D6869 Other thrombophilia: Secondary | ICD-10-CM

## 2020-10-13 NOTE — Progress Notes (Signed)
Primary Care Physician: Deland Pretty, MD Primary Cardiologist: Dr Gwenlyn Found Primary Electrophysiologist: none Referring Physician: [facility]   James Booth is a 70 y.o. male with a history of HTN, DM, atrial flutter who presents for follow up in the [facility].  The patient was initially diagnosed with atrial flutter 08/01/20 after presenting to the ED with palpitations and lightheadedness. He underwent DCCV at that time. Patient was started on Eliquis for a CHADS2VASC score of 3. He denies significant snoring or alcohol use.   On follow up today, patient reports that he has done well since his last visit. He denies any heart racing or palpitations. He denies any bleeding issues on anticoagulation. His esophogeal issues are his primary concern.   Today, he denies symptoms of palpitations, chest pain, shortness of breath, orthopnea, PND, lower extremity edema, dizziness, presyncope, syncope, snoring, daytime somnolence, bleeding, or neurologic sequela. The patient is tolerating medications without difficulties and is otherwise without complaint today.    Atrial Fibrillation Risk Factors:  he does not have symptoms or diagnosis of sleep apnea. he does not have a history of rheumatic fever. he does not have a history of alcohol use. The patient does have a history of early familial atrial fibrillation or other arrhythmias. Son has afib.  he has a BMI of Body mass index is 22.83 kg/m.Marland Kitchen Filed Weights   10/13/20 1554  Weight: 70.1 kg     Family History  Problem Relation Age of Onset   Heart disease Mother    ALS Father    Cancer Sister      Atrial Fibrillation Management history:  Previous antiarrhythmic drugs: none Previous cardioversions: 08/01/20 Previous ablations: none CHADS2VASC score: 3 Anticoagulation history: Eliquis   Past Medical History:  Diagnosis Date   Diabetes mellitus without complication ([place])    Esophageal stricture     Family history of heart disease    Hypertension    Nephrolithiasis    Prostate cancer ([place])    Past Surgical History:  Procedure Laterality Date   PROSTATECTOMY     TONSILLECTOMY     VARICOCELECTOMY      Current Outpatient Medications  Medication Sig Dispense Refill   acetaminophen (TYLENOL) 650 MG CR tablet Take 650 mg by mouth every 8 (eight) hours as needed for pain.     apixaban (ELIQUIS) 5 MG TABS tablet Take 1 tablet (5 mg total) by mouth 2 (two) times daily. 60 tablet 3   guaiFENesin (MUCINEX) 600 MG 12 hr tablet 2 tablet as needed     metFORMIN (GLUCOPHAGE) 500 MG tablet Take 500 mg by mouth daily.     olmesartan (BENICAR) 20 MG tablet Take 20 mg by mouth daily.     ONE TOUCH ULTRA TEST test strip      OZEMPIC, 1 MG/DOSE, 4 MG/3ML SOPN Inject into the skin.     rosuvastatin (CRESTOR) 20 MG tablet Take 20 mg by mouth daily.     No current facility-administered medications for this encounter.    Allergies  Allergen Reactions   Aspirin Nausea Only    Acid reflux    Social History   Socioeconomic History   Marital status: Divorced    Spouse name: Not on file   Number of children: 3   Years of education: BS    Highest education level: Not on file  Occupational History   Not on file  Tobacco Use   Smoking status: Never   Smokeless tobacco: Never  Vaping Use   Vaping Use: Never used  Substance and Sexual Activity   Alcohol use: Not Currently   Drug use: No   Sexual activity: Not on file  Other Topics Concern   Not on file  Social History Narrative   Patient has 2 sons and 1 daughter.    Patient is living with girlfriend. James Booth    Patient is divorced.    Patient has a B.S    Patient is right handed.    Patient is working at [employer].            Social Determinants of Health   Financial Resource Strain: Not on file  Food Insecurity: Not on file  Transportation Needs: Not on file  Physical Activity: Not on file  Stress: Not on file  Social  Connections: Not on file  Intimate Partner Violence: Not on file     ROS- All systems are reviewed and negative except as per the HPI above.  Physical Exam: Vitals:   10/13/20 1554  BP: 120/82  Pulse: 72  Weight: 70.1 kg  Height: '5\' 9"'$  (1.753 m)    GEN- The patient is a well appearing male, alert and oriented x 3 today.   HEENT-head normocephalic, atraumatic, sclera clear, conjunctiva pink, hearing intact, trachea midline. Lungs- Clear to ausculation bilaterally, normal work of breathing Heart- Regular rate and rhythm, no murmurs, rubs or gallops  GI- soft, NT, ND, + BS Extremities- no clubbing, cyanosis, or edema MS- no significant deformity or atrophy Skin- no rash or lesion Psych- euthymic mood, full affect Neuro- strength and sensation are intact   Wt Readings from Last 3 Encounters:  10/13/20 70.1 kg  08/08/20 70.6 kg  08/01/20 74.8 kg    EKG today demonstrates  SR, PVC Vent. rate 72 BPM PR interval 184 ms QRS duration 92 ms QT/QTcB 378/413 ms  Echo 08/10/20 demonstrated  1. Left ventricular ejection fraction, by estimation, is 55 to 60%. The  left ventricle has normal function. The left ventricle has no regional  wall motion abnormalities. Left ventricular diastolic parameters are  consistent with Grade I diastolic  dysfunction (impaired relaxation). The average left ventricular global  longitudinal strain is -19.5 %. The global longitudinal strain is normal.   2. Right ventricular systolic function is normal.   3. The mitral valve is normal in structure. Trivial mitral valve  regurgitation. No evidence of mitral stenosis.   4. The aortic valve is tricuspid. There is moderate calcification of the  aortic valve. There is moderate thickening of the aortic valve. Aortic  valve regurgitation is not visualized. Mild aortic valve sclerosis is  present, with no evidence of aortic  valve stenosis.   5. The inferior vena cava is normal in size with greater than 50%   respiratory variability, suggesting right atrial pressure of 3 mmHg.   Comparison(s): A prior study was performed on 05/19/2008. Prior images  unable to be directly viewed, comparison made by report only. Similar from  last report (unable to open images).   Epic records are reviewed at length today  CHA2DS2-VASc Score = 3  The patient's score is based upon: CHF History: 0 HTN History: 1 Diabetes History: 1 Stroke History: 0 Vascular Disease History: 0 Age Score: 1 Gender Score: 0     ASSESSMENT AND PLAN: 1. Typical atrial flutter The patient's CHA2DS2-VASc score is 3, indicating a 3.2% annual risk of stroke.   Patient appears to be maintaining SR. He will be undergoing esophageal dilatation with  [facility]. OK to hold Eliquis 5 mg BID 1-2 days prior per pharmacy recommendation. Risk stratification per [facility] pre op.  May consider flutter ablation in the future.   2. Secondary Hypercoagulable State (ICD10:  D68.69) The patient is at significant risk for stroke/thromboembolism based upon his CHA2DS2-VASc Score of 3.  Continue Apixaban (Eliquis).   3. HTN Stable, no changes today.   Follow up in the AF clinic in 4 months.    [facility] [address] [place], [place] [address] [phone] 10/13/2020 4:00 PM

## 2020-10-18 NOTE — Telephone Encounter (Signed)
   Patient Name: James Booth  DOB: 01-24-1951 MRN: [id number]  Primary Cardiologist: Dr. Gwenlyn Found, [place] [facility]  Chart reviewed as part of pre-operative protocol coverage. Was able to connect with patient. I reached out to patient for update on how he is doing. The patient affirms he has been doing well without any new cardiac symptoms. Therefore, based on ACC/AHA guidelines, the patient would be at acceptable risk for the planned procedure without further cardiovascular testing. The patient was advised that if he develops new symptoms prior to surgery to contact our office to arrange for a follow-up visit, and he verbalized understanding.  Per pharmD, Per office protocol, patient can hold Eliquis for 1-2 days prior to procedure.    Will route this bundled recommendation to requesting provider via Epic fax function. Please call with questions.   Charlie Pitter, PA-C 10/18/2020, 2:46 PM

## 2020-10-18 NOTE — Telephone Encounter (Signed)
Patient is returning call.  °

## 2020-10-18 NOTE — Telephone Encounter (Signed)
Tried to return call but patient has robo killer spam blocker on phone and call wont go through. Message left for him to return call and please turn off spam blocker so we can connect.

## 2020-10-18 NOTE — Telephone Encounter (Signed)
   Patient Name: James Booth  DOB: May 07, 1950 MRN: [id number]  Primary Cardiologist: previously Dr. Gwenlyn Found, most recently seen by [facility]  Chart reviewed as part of pre-operative protocol coverage. His most recent OV 10/13/20 was listed under [place]. Adline Peals indicated they do not do cardiac clearance in the AF clinic and routed to colleague. I will put this message back in pre-op pool. I reached out to patient for update on how he is doing, otherwise should be fine to clear with recs re: anticoag below. Got VM. LMTCB.  Charlie Pitter, PA-C 10/18/2020, 8:24 AM

## 2020-10-31 DIAGNOSIS — Z23 Encounter for immunization: Secondary | ICD-10-CM | POA: Diagnosis not present

## 2020-12-14 ENCOUNTER — Other Ambulatory Visit (HOSPITAL_COMMUNITY): Payer: Self-pay | Admitting: Physician Assistant

## 2020-12-21 DIAGNOSIS — H9042 Sensorineural hearing loss, unilateral, left ear, with unrestricted hearing on the contralateral side: Secondary | ICD-10-CM | POA: Diagnosis not present

## 2020-12-21 DIAGNOSIS — R070 Pain in throat: Secondary | ICD-10-CM | POA: Diagnosis not present

## 2020-12-21 DIAGNOSIS — K219 Gastro-esophageal reflux disease without esophagitis: Secondary | ICD-10-CM | POA: Diagnosis not present

## 2020-12-21 DIAGNOSIS — H838X2 Other specified diseases of left inner ear: Secondary | ICD-10-CM | POA: Diagnosis not present

## 2020-12-21 DIAGNOSIS — R1312 Dysphagia, oropharyngeal phase: Secondary | ICD-10-CM | POA: Diagnosis not present

## 2021-01-03 DIAGNOSIS — E785 Hyperlipidemia, unspecified: Secondary | ICD-10-CM | POA: Diagnosis not present

## 2021-01-03 DIAGNOSIS — E119 Type 2 diabetes mellitus without complications: Secondary | ICD-10-CM | POA: Diagnosis not present

## 2021-01-03 DIAGNOSIS — I10 Essential (primary) hypertension: Secondary | ICD-10-CM | POA: Diagnosis not present

## 2021-01-05 DIAGNOSIS — Z7901 Long term (current) use of anticoagulants: Secondary | ICD-10-CM | POA: Diagnosis not present

## 2021-01-05 DIAGNOSIS — E785 Hyperlipidemia, unspecified: Secondary | ICD-10-CM | POA: Diagnosis not present

## 2021-01-05 DIAGNOSIS — I10 Essential (primary) hypertension: Secondary | ICD-10-CM | POA: Diagnosis not present

## 2021-01-05 DIAGNOSIS — E118 Type 2 diabetes mellitus with unspecified complications: Secondary | ICD-10-CM | POA: Diagnosis not present

## 2021-01-05 DIAGNOSIS — I2584 Coronary atherosclerosis due to calcified coronary lesion: Secondary | ICD-10-CM | POA: Diagnosis not present

## 2021-01-05 DIAGNOSIS — I4892 Unspecified atrial flutter: Secondary | ICD-10-CM | POA: Diagnosis not present

## 2021-01-29 DIAGNOSIS — I4892 Unspecified atrial flutter: Secondary | ICD-10-CM

## 2021-01-29 HISTORY — DX: Unspecified atrial flutter: I48.92

## 2021-02-13 ENCOUNTER — Ambulatory Visit (HOSPITAL_COMMUNITY): Payer: Medicare Other | Admitting: Physician Assistant

## 2021-02-23 DIAGNOSIS — H25013 Cortical age-related cataract, bilateral: Secondary | ICD-10-CM | POA: Diagnosis not present

## 2021-05-02 DIAGNOSIS — Z7901 Long term (current) use of anticoagulants: Secondary | ICD-10-CM | POA: Diagnosis not present

## 2021-05-02 DIAGNOSIS — I10 Essential (primary) hypertension: Secondary | ICD-10-CM | POA: Diagnosis not present

## 2021-05-02 DIAGNOSIS — E785 Hyperlipidemia, unspecified: Secondary | ICD-10-CM | POA: Diagnosis not present

## 2021-05-02 DIAGNOSIS — E118 Type 2 diabetes mellitus with unspecified complications: Secondary | ICD-10-CM | POA: Diagnosis not present

## 2021-05-09 DIAGNOSIS — I10 Essential (primary) hypertension: Secondary | ICD-10-CM | POA: Diagnosis not present

## 2021-05-09 DIAGNOSIS — I4892 Unspecified atrial flutter: Secondary | ICD-10-CM | POA: Diagnosis not present

## 2021-05-09 DIAGNOSIS — I2584 Coronary atherosclerosis due to calcified coronary lesion: Secondary | ICD-10-CM | POA: Diagnosis not present

## 2021-05-09 DIAGNOSIS — E118 Type 2 diabetes mellitus with unspecified complications: Secondary | ICD-10-CM | POA: Diagnosis not present

## 2021-05-09 DIAGNOSIS — Z7901 Long term (current) use of anticoagulants: Secondary | ICD-10-CM | POA: Diagnosis not present

## 2021-09-04 DIAGNOSIS — E119 Type 2 diabetes mellitus without complications: Secondary | ICD-10-CM | POA: Diagnosis not present

## 2021-09-04 DIAGNOSIS — I10 Essential (primary) hypertension: Secondary | ICD-10-CM | POA: Diagnosis not present

## 2021-09-04 DIAGNOSIS — I2584 Coronary atherosclerosis due to calcified coronary lesion: Secondary | ICD-10-CM | POA: Diagnosis not present

## 2021-09-04 DIAGNOSIS — Z125 Encounter for screening for malignant neoplasm of prostate: Secondary | ICD-10-CM | POA: Diagnosis not present

## 2021-09-07 DIAGNOSIS — I2510 Atherosclerotic heart disease of native coronary artery without angina pectoris: Secondary | ICD-10-CM | POA: Diagnosis not present

## 2021-09-07 DIAGNOSIS — Z0000 Encounter for general adult medical examination without abnormal findings: Secondary | ICD-10-CM | POA: Diagnosis not present

## 2021-09-07 DIAGNOSIS — K200 Eosinophilic esophagitis: Secondary | ICD-10-CM | POA: Diagnosis not present

## 2021-09-07 DIAGNOSIS — I4892 Unspecified atrial flutter: Secondary | ICD-10-CM | POA: Diagnosis not present

## 2021-09-07 DIAGNOSIS — Z23 Encounter for immunization: Secondary | ICD-10-CM | POA: Diagnosis not present

## 2021-09-07 DIAGNOSIS — E118 Type 2 diabetes mellitus with unspecified complications: Secondary | ICD-10-CM | POA: Diagnosis not present

## 2021-09-07 DIAGNOSIS — J309 Allergic rhinitis, unspecified: Secondary | ICD-10-CM | POA: Diagnosis not present

## 2021-09-07 DIAGNOSIS — K219 Gastro-esophageal reflux disease without esophagitis: Secondary | ICD-10-CM | POA: Diagnosis not present

## 2021-09-07 DIAGNOSIS — D6859 Other primary thrombophilia: Secondary | ICD-10-CM | POA: Diagnosis not present

## 2021-09-07 DIAGNOSIS — I10 Essential (primary) hypertension: Secondary | ICD-10-CM | POA: Diagnosis not present

## 2021-09-07 DIAGNOSIS — N529 Male erectile dysfunction, unspecified: Secondary | ICD-10-CM | POA: Diagnosis not present

## 2021-09-07 DIAGNOSIS — Z8546 Personal history of malignant neoplasm of prostate: Secondary | ICD-10-CM | POA: Diagnosis not present

## 2021-10-11 ENCOUNTER — Ambulatory Visit: Payer: Medicare Other | Attending: Cardiovascular Disease | Admitting: Cardiovascular Disease

## 2021-10-11 ENCOUNTER — Encounter: Payer: Self-pay | Admitting: Cardiovascular Disease

## 2021-10-11 VITALS — BP 132/64 | HR 76 | Ht 69.0 in | Wt 156.8 lb

## 2021-10-11 DIAGNOSIS — I483 Typical atrial flutter: Secondary | ICD-10-CM | POA: Insufficient documentation

## 2021-10-11 DIAGNOSIS — I10 Essential (primary) hypertension: Secondary | ICD-10-CM | POA: Diagnosis not present

## 2021-10-11 DIAGNOSIS — E782 Mixed hyperlipidemia: Secondary | ICD-10-CM | POA: Diagnosis not present

## 2021-10-11 NOTE — Assessment & Plan Note (Signed)
History of paroxysmal atrial flutter status post cardioversion at [facility] 7//22. The CHA2DSVASC2 score is 3  .  He has had no recurrent episodes.  He is on Eliquis oral anticoagulation.  In the event that he has his esophagus stretched at [facility], it is okay for him to interrupt his Eliquis for this.

## 2021-10-11 NOTE — Assessment & Plan Note (Signed)
History of hyperlipidemia on statin therapy with lipid profile performed 01/03/2021 revealing total cholesterol 95, LDL 45 and HDL 45

## 2021-10-11 NOTE — Progress Notes (Signed)
10/11/2021 James Booth   16-Apr-1950  [id number]  Primary Physician Deland Pretty, MD Primary Cardiologist: Lorretta Harp MD [name], [place]  HPI:  James Booth is a 71 y.o.  divorced Caucasian male father of 62, who  lives with his long-time girlfriend. 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 I saw him in the office 10/14/2019.  His risk factors include family history with a mother who died at age 68 of myocardial infarction but otherwise is negative other than recently diagnosed type 2 diabetes and hypertension. He was never had a heart attack or stroke. At that time addendum a Myoview stress test was normal and his pain resolved. He does have a remote history of prostate cancer status post prostatectomy in 2004 by Dr. Irine Seal. He saw Dr. Deland Pretty recently for primary care who noted some PVCs and referred him back for further evaluation.   I did perform a Myoview stress test on him back in 2010 which was low risk nonischemic.  He is totally asymptomatic.  He did have a coronary calcium score performed 08/13/2019 which was essentially normal with a calcium score of only 3.  Since I saw him 2 years ago he did undergo DC cardioversion at [facility] on 08/01/20 because of PA flutter.  He said no recurrent episodes.  He is on Eliquis oral anticoagulation.  He denies chest pain or shortness of breath.  Current Meds  Medication Sig   acetaminophen (TYLENOL) 650 MG CR tablet Take 650 mg by mouth every 8 (eight) hours as needed for pain.   ELIQUIS 5 MG TABS tablet TAKE 1 TABLET BY MOUTH TWICE A DAY   guaiFENesin (MUCINEX) 600 MG 12 hr tablet 2 tablet as needed   metFORMIN (GLUCOPHAGE) 500 MG tablet Take 500 mg by mouth daily.   olmesartan (BENICAR) 20 MG tablet Take 20 mg by mouth daily.   ONE TOUCH ULTRA TEST test strip    OZEMPIC, 1 MG/DOSE, 4 MG/3ML SOPN Inject into the skin.   rosuvastatin (CRESTOR) 20 MG tablet Take 20 mg by mouth daily.      Allergies  Allergen Reactions   Aspirin Nausea Only    Acid reflux    Social History   Socioeconomic History   Marital status: Divorced    Spouse name: Not on file   Number of children: 3   Years of education: BS    Highest education level: Not on file  Occupational History   Not on file  Tobacco Use   Smoking status: Never   Smokeless tobacco: Never  Vaping Use   Vaping Use: Never used  Substance and Sexual Activity   Alcohol use: Not Currently   Drug use: No   Sexual activity: Not on file  Other Topics Concern   Not on file  Social History Narrative   Patient has 2 sons and 1 daughter.    Patient is living with girlfriend. [name]    Patient is divorced.    Patient has a B.S    Patient is right handed.    Patient is working at [employer].            Social Determinants of Health   Financial Resource Strain: Not on file  Food Insecurity: Not on file  Transportation Needs: Not on file  Physical Activity: Not on file  Stress: Not on file  Social Connections: Not on file  Intimate Partner Violence:  Not on file     Review of Systems: General: negative for chills, fever, night sweats or weight changes.  Cardiovascular: negative for chest pain, dyspnea on exertion, edema, orthopnea, palpitations, paroxysmal nocturnal dyspnea or shortness of breath Dermatological: negative for rash Respiratory: negative for cough or wheezing Urologic: negative for hematuria Abdominal: negative for nausea, vomiting, diarrhea, bright red blood per rectum, melena, or hematemesis Neurologic: negative for visual changes, syncope, or dizziness All other systems reviewed and are otherwise negative except as noted above.    Blood pressure 132/64, pulse 76, height '5\' 9"'$  (1.753 m), weight 156 lb 12.8 oz (71.1 kg), SpO2 98 %.  General appearance: alert and no distress Neck: no adenopathy, no carotid bruit, no JVD, supple, symmetrical, trachea midline, and thyroid not enlarged, symmetric,  no tenderness/mass/nodules Lungs: clear to auscultation bilaterally Heart: regular rate and rhythm, S1, S2 normal, no murmur, click, rub or gallop Extremities: extremities normal, atraumatic, no cyanosis or edema Pulses: 2+ and symmetric Skin: Skin color, texture, turgor normal. No rashes or lesions Neurologic: Grossly normal  EKG sinus rhythm at 76 without ST or T wave changes.  Personally reviewed this EKG.  ASSESSMENT AND PLAN:   Essential hypertension History of essential hypertension a blood pressure measured today at 132/64.  He is on Benicar.  Hyperlipidemia History of hyperlipidemia on statin therapy with lipid profile performed 01/03/2021 revealing total cholesterol 95, LDL 45 and HDL 45  Typical atrial flutter (HCC) History of paroxysmal atrial flutter status post cardioversion at [facility] 7//22. The CHA2DSVASC2 score is 3  .  He has had no recurrent episodes.  He is on Eliquis oral anticoagulation.  In the event that he has his esophagus stretched at [facility], it is okay for him to interrupt his Eliquis for this.     Lorretta Harp MD FACP,FACC,FAHA, [facility] 10/11/2021 2:14 PM

## 2021-10-11 NOTE — Patient Instructions (Signed)
Medication Instructions:  Your physician recommends that you continue on your current medications as directed. Please refer to the Current Medication list given to you today.  *If you need a refill on your cardiac medications before your next appointment, please call your pharmacy*   Follow-Up: At [facility], you and your health needs are our priority.  As part of our continuing mission to provide you with exceptional heart care, we have created designated Provider Care Teams.  These Care Teams include your primary Cardiologist (physician) and Advanced Practice Providers (APPs -  Physician Assistants and Nurse Practitioners) who all work together to provide you with the care you need, when you need it.  We recommend signing up for the patient portal called "MyChart".  Sign up information is provided on this After Visit Summary.  MyChart is used to connect with patients for Virtual Visits (Telemedicine).  Patients are able to view lab/test results, encounter notes, upcoming appointments, etc.  Non-urgent messages can be sent to your provider as well.   To learn more about what you can do with MyChart, go to [email or web address].    Your next appointment:   12 month(s)  The format for your next appointment:   In Person  Provider:   Jonathan Berry, MD   

## 2021-10-11 NOTE — Assessment & Plan Note (Signed)
History of essential hypertension a blood pressure measured today at 132/64.  He is on Benicar.

## 2021-10-30 DIAGNOSIS — Z23 Encounter for immunization: Secondary | ICD-10-CM | POA: Diagnosis not present

## 2021-11-07 DIAGNOSIS — S0501XA Injury of conjunctiva and corneal abrasion without foreign body, right eye, initial encounter: Secondary | ICD-10-CM | POA: Diagnosis not present

## 2021-11-22 DIAGNOSIS — R319 Hematuria, unspecified: Secondary | ICD-10-CM | POA: Diagnosis not present

## 2021-11-22 DIAGNOSIS — Z125 Encounter for screening for malignant neoplasm of prostate: Secondary | ICD-10-CM | POA: Diagnosis not present

## 2021-11-22 DIAGNOSIS — Z8719 Personal history of other diseases of the digestive system: Secondary | ICD-10-CM | POA: Diagnosis not present

## 2021-11-22 DIAGNOSIS — R1030 Lower abdominal pain, unspecified: Secondary | ICD-10-CM | POA: Diagnosis not present

## 2021-11-22 DIAGNOSIS — Z9079 Acquired absence of other genital organ(s): Secondary | ICD-10-CM | POA: Diagnosis not present

## 2021-11-22 DIAGNOSIS — Z8546 Personal history of malignant neoplasm of prostate: Secondary | ICD-10-CM | POA: Diagnosis not present

## 2021-11-28 ENCOUNTER — Encounter (HOSPITAL_BASED_OUTPATIENT_CLINIC_OR_DEPARTMENT_OTHER): Payer: Self-pay | Admitting: Emergency Medicine

## 2021-11-28 ENCOUNTER — Emergency Department (HOSPITAL_BASED_OUTPATIENT_CLINIC_OR_DEPARTMENT_OTHER)
Admission: EM | Admit: 2021-11-28 | Discharge: 2021-11-28 | Disposition: A | Discharge status: Home / Self Care | Payer: Medicare Other | Attending: Emergency Medicine | Admitting: Emergency Medicine

## 2021-11-28 ENCOUNTER — Other Ambulatory Visit: Payer: Self-pay

## 2021-11-28 ENCOUNTER — Emergency Department (HOSPITAL_BASED_OUTPATIENT_CLINIC_OR_DEPARTMENT_OTHER): Payer: Medicare Other

## 2021-11-28 ENCOUNTER — Other Ambulatory Visit: Payer: Self-pay | Admitting: Registered Nurse

## 2021-11-28 ENCOUNTER — Other Ambulatory Visit (HOSPITAL_BASED_OUTPATIENT_CLINIC_OR_DEPARTMENT_OTHER): Payer: Self-pay | Admitting: Registered Nurse

## 2021-11-28 DIAGNOSIS — N200 Calculus of kidney: Secondary | ICD-10-CM | POA: Diagnosis not present

## 2021-11-28 DIAGNOSIS — Z7984 Long term (current) use of oral hypoglycemic drugs: Secondary | ICD-10-CM | POA: Insufficient documentation

## 2021-11-28 DIAGNOSIS — E119 Type 2 diabetes mellitus without complications: Secondary | ICD-10-CM | POA: Insufficient documentation

## 2021-11-28 DIAGNOSIS — Z794 Long term (current) use of insulin: Secondary | ICD-10-CM | POA: Diagnosis not present

## 2021-11-28 DIAGNOSIS — Z7901 Long term (current) use of anticoagulants: Secondary | ICD-10-CM | POA: Diagnosis not present

## 2021-11-28 DIAGNOSIS — R1030 Lower abdominal pain, unspecified: Secondary | ICD-10-CM

## 2021-11-28 DIAGNOSIS — R000 Tachycardia, unspecified: Secondary | ICD-10-CM | POA: Diagnosis not present

## 2021-11-28 DIAGNOSIS — Z79899 Other long term (current) drug therapy: Secondary | ICD-10-CM | POA: Insufficient documentation

## 2021-11-28 DIAGNOSIS — R748 Abnormal levels of other serum enzymes: Secondary | ICD-10-CM | POA: Diagnosis not present

## 2021-11-28 DIAGNOSIS — N281 Cyst of kidney, acquired: Secondary | ICD-10-CM | POA: Diagnosis not present

## 2021-11-28 DIAGNOSIS — N5082 Scrotal pain: Secondary | ICD-10-CM | POA: Diagnosis not present

## 2021-11-28 DIAGNOSIS — I10 Essential (primary) hypertension: Secondary | ICD-10-CM | POA: Diagnosis not present

## 2021-11-28 DIAGNOSIS — R319 Hematuria, unspecified: Secondary | ICD-10-CM

## 2021-11-28 LAB — URINALYSIS, ROUTINE W REFLEX MICROSCOPIC
Bilirubin Urine: NEGATIVE
Glucose, UA: NEGATIVE mg/dL
Ketones, ur: NEGATIVE mg/dL
Leukocytes,Ua: NEGATIVE
Nitrite: NEGATIVE
Specific Gravity, Urine: 1.024 (ref 1.005–1.030)
pH: 6.5 (ref 5.0–8.0)

## 2021-11-28 LAB — CBC
HCT: 43 % (ref 39.0–52.0)
Hemoglobin: 14.6 g/dL (ref 13.0–17.0)
MCH: 31 pg (ref 26.0–34.0)
MCHC: 34 g/dL (ref 30.0–36.0)
MCV: 91.3 fL (ref 80.0–100.0)
Platelets: 323 10*3/uL (ref 150–400)
RBC: 4.71 MIL/uL (ref 4.22–5.81)
RDW: 11.7 % (ref 11.5–15.5)
WBC: 8.8 10*3/uL (ref 4.0–10.5)
nRBC: 0 % (ref 0.0–0.2)

## 2021-11-28 LAB — COMPREHENSIVE METABOLIC PANEL
ALT: 17 U/L (ref 0–44)
AST: 14 U/L — ABNORMAL LOW (ref 15–41)
Albumin: 4.2 g/dL (ref 3.5–5.0)
Alkaline Phosphatase: 81 U/L (ref 38–126)
Anion gap: 7 (ref 5–15)
BUN: 15 mg/dL (ref 8–23)
CO2: 30 mmol/L (ref 22–32)
Calcium: 9.6 mg/dL (ref 8.9–10.3)
Chloride: 101 mmol/L (ref 98–111)
Creatinine, Ser: 1.01 mg/dL (ref 0.61–1.24)
GFR, Estimated: >60 mL/min (ref >60)
Glucose, Bld: 162 mg/dL — ABNORMAL HIGH (ref 70–99)
Potassium: 3.9 mmol/L (ref 3.5–5.1)
Sodium: 138 mmol/L (ref 135–145)
Total Bilirubin: 0.4 mg/dL (ref 0.3–1.2)
Total Protein: 7.5 g/dL (ref 6.5–8.1)

## 2021-11-28 LAB — LIPASE, BLOOD: Lipase: 55 U/L — ABNORMAL HIGH (ref 11–51)

## 2021-11-28 MED ORDER — IOHEXOL 300 MG/ML  SOLN
100.0000 mL | Freq: Once | INTRAMUSCULAR | Status: AC | PRN
  Administered 2021-11-28: 80 mL via INTRAVENOUS

## 2021-11-28 NOTE — Discharge Instructions (Addendum)
Your pain may be due to a recently passed kidney stone.  You do have trace of blood in your urine as well as evidence of nonobstructive kidney stones on both sides of the kidney.  You may follow-up with your urologist as previously scheduled.  Return if you develop worsening abdominal pain, fever, or if you have any other concern.

## 2021-11-28 NOTE — ED Triage Notes (Signed)
Pt arrives pov, steady gait, c/o lower abdominal pain and groin pain x 3 weeks. Has [place] sched but was referred to ED for his pain. Endorses constipation and diarrhea, denies dysuria

## 2021-11-28 NOTE — ED Provider Notes (Signed)
[place] EMERGENCY DEPT Provider Note   CSN: [id number] Arrival date & time: 11/28/21  1701     History  Chief Complaint  Patient presents with   Abdominal Pain    James Booth is a 71 y.o. male.  The history is provided by the patient and medical records. No language interpreter was used.  Abdominal Pain    71 year old male significant history of hypertension, diabetes, hyperlipidemia, hypercoagulable state currently on Eliquis presenting with complaint of abdominal pain.  Patient report for the past 2 to 3 weeks he has had recurrent pain.  He described pain as an uncomfortable sensation across his lower abdomen.  Pain is waxing waning currently rates as 3 out of 10.  No fever or chills no chest pain or shortness of breath no back pain no urinary symptoms no blood in the urine no scrotal pain.  He has not noticed any blood in his stool.  He was seen by his PCP a week ago for his complaint.  At that time he was told that there is trace of blood in his urine and he does have an appointment with urologist in the near future.  He also had an x-ray at that time that showed some signs of constipation.  He was recommended to take milk of magnesia without adequate relief.  Patient has had history of diverticulitis in the past.  He also had a colonoscopy 3 years ago with benign polyps.  Home Medications Prior to Admission medications   Medication Sig Start Date End Date Taking? Authorizing Provider  acetaminophen (TYLENOL) 650 MG CR tablet Take 650 mg by mouth every 8 (eight) hours as needed for pain.    [provider]  ELIQUIS 5 MG TABS tablet TAKE 1 TABLET BY MOUTH TWICE A DAY 12/14/20   Fenton, Clint R, PA  guaiFENesin (MUCINEX) 600 MG 12 hr tablet 2 tablet as needed 01/16/12   [provider]  metFORMIN (GLUCOPHAGE) 500 MG tablet Take 500 mg by mouth daily. 08/28/19   [provider]  olmesartan (BENICAR) 20 MG tablet Take 20 mg by mouth daily.  05/18/20   [provider]  ONE TOUCH ULTRA TEST test strip  08/04/13   [provider]  OZEMPIC, 1 MG/DOSE, 4 MG/3ML SOPN Inject into the skin. 04/08/20   [provider]  rosuvastatin (CRESTOR) 20 MG tablet Take 20 mg by mouth daily.    [provider]      Allergies    Aspirin    Review of Systems   Review of Systems  Gastrointestinal:  Positive for abdominal pain.  All other systems reviewed and are negative.   Physical Exam Updated Vital Signs BP (!) 140/85 (BP Location: Right Arm)   Pulse (!) 115   Temp 98.4 F (36.9 C) (Oral)   Resp 20   Ht '5\' 9"'$  (1.753 m)   Wt 70.3 kg   SpO2 100%   BMI 22.89 kg/m  Physical Exam Vitals and nursing note reviewed.  Constitutional:      General: He is not in acute distress.    Appearance: He is well-developed.  HENT:     Head: Atraumatic.  Eyes:     Conjunctiva/sclera: Conjunctivae normal.  Cardiovascular:     Rate and Rhythm: Normal rate and regular rhythm.  Pulmonary:     Effort: Pulmonary effort is normal.     Breath sounds: Normal breath sounds.  Abdominal:     Palpations: Abdomen is soft.  Tenderness: There is abdominal tenderness (Mild suprapubic tenderness).  Musculoskeletal:     Cervical back: Neck supple.  Skin:    Findings: No rash.  Neurological:     Mental Status: He is alert. Mental status is at baseline.     ED Results / Procedures / Treatments   Labs (all labs ordered are listed, but only abnormal results are displayed) Labs Reviewed  LIPASE, BLOOD - Abnormal; Notable for the following components:      Result Value   Lipase 55 (*)    All other components within normal limits  COMPREHENSIVE METABOLIC PANEL - Abnormal; Notable for the following components:   Glucose, Bld 162 (*)    AST 14 (*)    All other components within normal limits  URINALYSIS, ROUTINE W REFLEX MICROSCOPIC - Abnormal; Notable for the following components:   Hgb urine dipstick TRACE (*)     Protein, ur TRACE (*)    All other components within normal limits  CBC    EKG EKG Interpretation  Date/Time:  Tuesday November 28 2021 19:58:24 EDT Ventricular Rate:  101 PR Interval:  212 QRS Duration: 98 QT Interval:  334 QTC Calculation: 433 R Axis:   38 Text Interpretation: Sinus tachycardia Borderline prolonged PR interval Abnormal R-wave progression, early transition No significant change was found Confirmed by Ezequiel Essex [phone]) on 11/28/2021 8:18:03 PM  Radiology US SCROTUM W/DOPPLER  Result Date: 11/28/2021 CLINICAL DATA:  Pelvic and scrotal pain. Prostatectomy 2004. Small hydroceles on CT today. EXAM: SCROTAL ULTRASOUND DOPPLER ULTRASOUND OF THE TESTICLES TECHNIQUE: Complete ultrasound examination of the testicles, epididymis, and other scrotal structures was performed. Color and spectral Doppler ultrasound were also utilized to evaluate blood flow to the testicles. COMPARISON:  CT with IV contrast earlier today. FINDINGS: Right testicle Measurements: 3.8 x 1.5 x 2.3 cm. No mass is seen. 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 Left testicle Measurements: 3.0 x 1.9 x 2.2 cm. No mass is seen. 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 There is a 2.4 mm simple cyst in the inferior pole of this testicle. Right epididymis:  Normal in size and appearance. Left epididymis:  Normal in size and appearance. Hydrocele: There are small symmetric uncomplicated scrotal hydroceles. Varicocele:  None visualized. Pulsed Doppler interrogation of both testes demonstrates normal low resistance arterial and venous waveforms bilaterally. IMPRESSION: 1. There is no evidence of testicular torsion or mass. 2. 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 2.4 mm simple cyst in the inferior pole of the left testicle. 3. Small symmetric uncomplicated scrotal hydroceles. Electronically Signed   By: Telford Nab M.D.   On: 11/28/2021 20:49   CT ABDOMEN PELVIS W CONTRAST  Result Date:  11/28/2021 CLINICAL DATA:  Lower abdominal pain and groin pain for 3 weeks. Constipation and diarrhea. EXAM: CT ABDOMEN AND PELVIS WITH CONTRAST TECHNIQUE: Multidetector CT imaging of the abdomen and pelvis was performed using the standard protocol following bolus administration of intravenous contrast. RADIATION DOSE REDUCTION: This exam was performed according to the departmental dose-optimization program which includes automated exposure control, adjustment of the mA and/or kV according to patient size and/or use of iterative reconstruction technique. CONTRAST:  73m OMNIPAQUE IOHEXOL 300 MG/ML  SOLN COMPARISON:  None Available. FINDINGS: Lower chest: Lung bases are clear. The imaged heart is unremarkable. Hepatobiliary: Numerous lobulated hepatic cysts are seen throughout the liver which requiring no specific imaging follow-up. There are no suspicious lesions. The gallbladder is unremarkable. There is no biliary ductal dilatation. Pancreas: Unremarkable. Spleen: Unremarkable. Adrenals/Urinary Tract:  The adrenals are unremarkable. There is a 1.2 cm nonobstructing stone in the right interpolar region with adjacent cortical scarring. A few additional punctate nonobstructing bilateral renal stones are seen. There are a few subcentimeter hypodense lesions in the kidneys which are too small to characterize but likely reflect benign cysts for which no specific imaging follow-up is required. There are no suspicious lesions. There is no hydronephrosis or hydroureter. There is symmetric excretion of contrast into the collecting systems on the delayed images. There are no stones along the course of either ureter. The bladder is unremarkable. Stomach/Bowel: The stomach is unremarkable. There is no evidence of bowel obstruction. There is no abnormal bowel wall thickening or inflammatory change. The appendix is normal. Vascular/Lymphatic: The abdominal aorta is normal in course and caliber. The major branch vessels are  patent. The main portal and splenic veins are patent. There is no abdominal or pelvic lymphadenopathy. Reproductive: No significant prostate tissue is seen, possibly postprocedural. Other: There is no ascites or free air. There is a tiny fat containing umbilical hernia. No inguinal hernia is seen. Musculoskeletal: There is no acute osseous abnormality or suspicious osseous lesion. IMPRESSION: 1. No acute finding in the abdomen or pelvis to explain the patient's symptoms. 2. 1.2 cm right renal stone with adjacent cortical scarring and scattered additional punctate nonobstructing stones bilaterally. Electronically Signed   By: Valetta Mole M.D.   On: 11/28/2021 19:09    Procedures Procedures    Medications Ordered in ED Medications  iohexol (OMNIPAQUE) 300 MG/ML solution 100 mL (80 mLs Intravenous Contrast Given 11/28/21 1847)    ED Course/ Medical Decision Making/ A&P                           Medical Decision Making Amount and/or Complexity of Data Reviewed Labs: ordered. Radiology: ordered.  Risk Prescription drug management.   BP (!) 140/85 (BP Location: Right Arm)   Pulse (!) 115   Temp 98.4 F (36.9 C) (Oral)   Resp 20   Ht '5\' 9"'$  (1.753 m)   Wt 70.3 kg   SpO2 100%   BMI 22.89 kg/m   8:35 PM 71 year old male significant history of hypertension, diabetes, hyperlipidemia, hypercoagulable state currently on Eliquis presenting with complaint of abdominal pain.  Patient report for the past 2 to 3 weeks he has had recurrent pain.  He described pain as an uncomfortable sensation across his lower abdomen.  Pain is waxing waning currently rates as 3 out of 10.  No fever or chills no chest pain or shortness of breath no back pain no urinary symptoms no blood in the urine no scrotal pain.  He has not noticed any blood in his stool.  He was seen by his PCP a week ago for his complaint.  At that time he was told that there is trace of blood in his urine and he does have an appointment with  urologist in the near future.  He also had an x-ray at that time that showed some signs of constipation.  He was recommended to take milk of magnesia without adequate relief.  Patient has had history of diverticulitis in the past.  He also had a colonoscopy 3 years ago with benign polyps.  On exam this is a well appearing elderly male in no apparent distress.  Mild suprapubic tenderness without guarding or rebound tenderness.  Heart and lung sounds normal.  Able to move all extremities without difficulty.  Vital signs  remarkable for mild tachycardia with heart rate of 106.  Patient is afebrile, no hypoxia, no tachypnea.  7:38 PM -Labs ordered, independently viewed and interpreted by me.  Labs remarkable for mildly elevated lipase of 55, CBG 162, normal WBC, normal H&H, UA without signs of UTI but does show trace Hgb. -EKG interpreted by me -imaging independently viewed and interpreted by me and I agree with radiologist's interpretation.  Result remarkable for no acute finding but there's evidence of non-obstructive kidney stones -DDx: kidney stone, colitis, diverticulitis, incarcerated hernia, cellulitis -treatment includes reassurance -PCP office notes or outside notes reviewed -Discussion with attending Dr. Wyvonnia Dusky who ordered scrotal US which resulted and without acute changes.  EKG shows sinus tachycardia -Escalation to admission/observation considered: patients feels much better, is comfortable with discharge, and will follow up with PCP -Prescription medication considered, patient comfortable with OTC meds -Social Determinant of Health considered.  Suspect pt had a recently passed kidney stone.  Outpt f/u with urology recommended.          Final Clinical Impression(s) / ED Diagnoses Final diagnoses:  Lower abdominal pain    Rx / DC Orders ED Discharge Orders     None         Domenic Moras, PA-C 11/28/21 2112    Ezequiel Essex, MD 11/29/21 [phone]

## 2021-12-06 DIAGNOSIS — N200 Calculus of kidney: Secondary | ICD-10-CM | POA: Diagnosis not present

## 2021-12-06 DIAGNOSIS — R102 Pelvic and perineal pain: Secondary | ICD-10-CM | POA: Diagnosis not present

## 2021-12-06 DIAGNOSIS — Z8546 Personal history of malignant neoplasm of prostate: Secondary | ICD-10-CM | POA: Diagnosis not present

## 2021-12-12 DIAGNOSIS — E1169 Type 2 diabetes mellitus with other specified complication: Secondary | ICD-10-CM | POA: Diagnosis not present

## 2021-12-12 DIAGNOSIS — E118 Type 2 diabetes mellitus with unspecified complications: Secondary | ICD-10-CM | POA: Diagnosis not present

## 2021-12-12 DIAGNOSIS — E785 Hyperlipidemia, unspecified: Secondary | ICD-10-CM | POA: Diagnosis not present

## 2021-12-12 DIAGNOSIS — I10 Essential (primary) hypertension: Secondary | ICD-10-CM | POA: Diagnosis not present

## 2021-12-12 DIAGNOSIS — I4892 Unspecified atrial flutter: Secondary | ICD-10-CM | POA: Diagnosis not present

## 2021-12-14 ENCOUNTER — Telehealth: Payer: Self-pay

## 2021-12-14 NOTE — Telephone Encounter (Signed)
        Patient  visited [facility] on 11/28/2021  for Lower abdominal pain, unspecified.   Telephone encounter attempt :  1st  A HIPAA compliant voice message was left requesting a return call.  Instructed patient to call back at [phone].   [place] Resource Care Guide   [email or web address]  ?? [phone]   Website: [email or web address]  [email or web address]

## 2021-12-15 ENCOUNTER — Telehealth: Payer: Self-pay

## 2021-12-15 NOTE — Telephone Encounter (Signed)
     Patient  visit on 11/14/2021  at [facility] was for Lower abdominal pain, unspecified.  Have you been able to follow up with your primary care physician? Patient saw a Dealer after his ER visit and is feeling better.   The patient was or was not able to obtain any needed medicine or equipment. No medications prescribed.  Are there diet recommendations that you are having difficulty following? No  Patient expresses understanding of discharge instructions and education provided has no other needs at this time.    [place] Resource Care Guide   ??[email or web address]  ?? [phone]   Website: [email or web address]  [email or web address]

## 2021-12-28 ENCOUNTER — Other Ambulatory Visit (HOSPITAL_COMMUNITY): Payer: Self-pay | Admitting: Physician Assistant

## 2021-12-28 DIAGNOSIS — I483 Typical atrial flutter: Secondary | ICD-10-CM

## 2021-12-28 NOTE — Telephone Encounter (Signed)
Prescription refill request for Eliquis received. Indication: Aflutter Last office visit: 10/11/21 Gwenlyn Found)  Scr: 1.01 (11/28/21) Age: 71 Weight: 70.3kg  Appropriate dose and refill sent to requested pharmacy.

## 2022-02-13 DIAGNOSIS — M791 Myalgia, unspecified site: Secondary | ICD-10-CM | POA: Diagnosis not present

## 2022-02-26 DIAGNOSIS — H2513 Age-related nuclear cataract, bilateral: Secondary | ICD-10-CM | POA: Diagnosis not present

## 2022-03-15 DIAGNOSIS — E1169 Type 2 diabetes mellitus with other specified complication: Secondary | ICD-10-CM | POA: Diagnosis not present

## 2022-03-15 DIAGNOSIS — I2510 Atherosclerotic heart disease of native coronary artery without angina pectoris: Secondary | ICD-10-CM | POA: Diagnosis not present

## 2022-03-15 DIAGNOSIS — I10 Essential (primary) hypertension: Secondary | ICD-10-CM | POA: Diagnosis not present

## 2022-03-15 DIAGNOSIS — E785 Hyperlipidemia, unspecified: Secondary | ICD-10-CM | POA: Diagnosis not present

## 2022-03-15 DIAGNOSIS — I4892 Unspecified atrial flutter: Secondary | ICD-10-CM | POA: Diagnosis not present

## 2022-03-15 DIAGNOSIS — Z7901 Long term (current) use of anticoagulants: Secondary | ICD-10-CM | POA: Diagnosis not present

## 2022-03-15 DIAGNOSIS — I2584 Coronary atherosclerosis due to calcified coronary lesion: Secondary | ICD-10-CM | POA: Diagnosis not present

## 2022-06-12 DIAGNOSIS — E1169 Type 2 diabetes mellitus with other specified complication: Secondary | ICD-10-CM | POA: Diagnosis not present

## 2022-06-12 DIAGNOSIS — I2510 Atherosclerotic heart disease of native coronary artery without angina pectoris: Secondary | ICD-10-CM | POA: Diagnosis not present

## 2022-06-12 DIAGNOSIS — I10 Essential (primary) hypertension: Secondary | ICD-10-CM | POA: Diagnosis not present

## 2022-06-14 DIAGNOSIS — I4892 Unspecified atrial flutter: Secondary | ICD-10-CM | POA: Diagnosis not present

## 2022-06-14 DIAGNOSIS — E785 Hyperlipidemia, unspecified: Secondary | ICD-10-CM | POA: Diagnosis not present

## 2022-06-14 DIAGNOSIS — I2584 Coronary atherosclerosis due to calcified coronary lesion: Secondary | ICD-10-CM | POA: Diagnosis not present

## 2022-06-14 DIAGNOSIS — E1169 Type 2 diabetes mellitus with other specified complication: Secondary | ICD-10-CM | POA: Diagnosis not present

## 2022-06-14 DIAGNOSIS — Z7901 Long term (current) use of anticoagulants: Secondary | ICD-10-CM | POA: Diagnosis not present

## 2022-06-14 DIAGNOSIS — I10 Essential (primary) hypertension: Secondary | ICD-10-CM | POA: Diagnosis not present

## 2022-06-14 DIAGNOSIS — I2510 Atherosclerotic heart disease of native coronary artery without angina pectoris: Secondary | ICD-10-CM | POA: Diagnosis not present

## 2022-08-10 DIAGNOSIS — K1370 Unspecified lesions of oral mucosa: Secondary | ICD-10-CM | POA: Diagnosis not present

## 2022-08-10 DIAGNOSIS — K1329 Other disturbances of oral epithelium, including tongue: Secondary | ICD-10-CM | POA: Diagnosis not present

## 2022-08-27 DIAGNOSIS — H40053 Ocular hypertension, bilateral: Secondary | ICD-10-CM | POA: Diagnosis not present

## 2022-09-10 DIAGNOSIS — Z125 Encounter for screening for malignant neoplasm of prostate: Secondary | ICD-10-CM | POA: Diagnosis not present

## 2022-09-10 DIAGNOSIS — I10 Essential (primary) hypertension: Secondary | ICD-10-CM | POA: Diagnosis not present

## 2022-09-11 DIAGNOSIS — R1030 Lower abdominal pain, unspecified: Secondary | ICD-10-CM | POA: Diagnosis not present

## 2022-09-11 DIAGNOSIS — I2510 Atherosclerotic heart disease of native coronary artery without angina pectoris: Secondary | ICD-10-CM | POA: Diagnosis not present

## 2022-09-11 DIAGNOSIS — I10 Essential (primary) hypertension: Secondary | ICD-10-CM | POA: Diagnosis not present

## 2022-09-11 DIAGNOSIS — Z0000 Encounter for general adult medical examination without abnormal findings: Secondary | ICD-10-CM | POA: Diagnosis not present

## 2022-09-11 DIAGNOSIS — N200 Calculus of kidney: Secondary | ICD-10-CM | POA: Diagnosis not present

## 2022-09-11 DIAGNOSIS — J309 Allergic rhinitis, unspecified: Secondary | ICD-10-CM | POA: Diagnosis not present

## 2022-09-11 DIAGNOSIS — E118 Type 2 diabetes mellitus with unspecified complications: Secondary | ICD-10-CM | POA: Diagnosis not present

## 2022-09-11 DIAGNOSIS — F411 Generalized anxiety disorder: Secondary | ICD-10-CM | POA: Diagnosis not present

## 2022-09-11 DIAGNOSIS — K219 Gastro-esophageal reflux disease without esophagitis: Secondary | ICD-10-CM | POA: Diagnosis not present

## 2022-09-11 DIAGNOSIS — I4892 Unspecified atrial flutter: Secondary | ICD-10-CM | POA: Diagnosis not present

## 2022-09-11 DIAGNOSIS — R29898 Other symptoms and signs involving the musculoskeletal system: Secondary | ICD-10-CM | POA: Diagnosis not present

## 2022-09-26 DIAGNOSIS — F411 Generalized anxiety disorder: Secondary | ICD-10-CM | POA: Diagnosis not present

## 2022-09-26 DIAGNOSIS — R1030 Lower abdominal pain, unspecified: Secondary | ICD-10-CM | POA: Diagnosis not present

## 2022-09-26 DIAGNOSIS — Z23 Encounter for immunization: Secondary | ICD-10-CM | POA: Diagnosis not present

## 2022-10-19 DIAGNOSIS — R319 Hematuria, unspecified: Secondary | ICD-10-CM | POA: Diagnosis not present

## 2022-11-15 DIAGNOSIS — R1310 Dysphagia, unspecified: Secondary | ICD-10-CM | POA: Diagnosis not present

## 2022-11-15 DIAGNOSIS — R109 Unspecified abdominal pain: Secondary | ICD-10-CM | POA: Diagnosis not present

## 2022-11-16 DIAGNOSIS — F411 Generalized anxiety disorder: Secondary | ICD-10-CM | POA: Diagnosis not present

## 2022-12-04 DIAGNOSIS — I10 Essential (primary) hypertension: Secondary | ICD-10-CM | POA: Diagnosis not present

## 2022-12-04 DIAGNOSIS — Z7901 Long term (current) use of anticoagulants: Secondary | ICD-10-CM | POA: Diagnosis not present

## 2022-12-04 DIAGNOSIS — I2510 Atherosclerotic heart disease of native coronary artery without angina pectoris: Secondary | ICD-10-CM | POA: Diagnosis not present

## 2022-12-04 DIAGNOSIS — I2584 Coronary atherosclerosis due to calcified coronary lesion: Secondary | ICD-10-CM | POA: Diagnosis not present

## 2022-12-04 DIAGNOSIS — E1169 Type 2 diabetes mellitus with other specified complication: Secondary | ICD-10-CM | POA: Diagnosis not present

## 2022-12-04 DIAGNOSIS — E785 Hyperlipidemia, unspecified: Secondary | ICD-10-CM | POA: Diagnosis not present

## 2022-12-06 DIAGNOSIS — R310 Gross hematuria: Secondary | ICD-10-CM | POA: Diagnosis not present

## 2022-12-06 DIAGNOSIS — N200 Calculus of kidney: Secondary | ICD-10-CM | POA: Diagnosis not present

## 2022-12-26 DIAGNOSIS — F411 Generalized anxiety disorder: Secondary | ICD-10-CM | POA: Diagnosis not present

## 2023-01-08 ENCOUNTER — Other Ambulatory Visit: Payer: Self-pay | Admitting: Cardiovascular Disease

## 2023-01-08 DIAGNOSIS — I2584 Coronary atherosclerosis due to calcified coronary lesion: Secondary | ICD-10-CM | POA: Diagnosis not present

## 2023-01-08 DIAGNOSIS — I10 Essential (primary) hypertension: Secondary | ICD-10-CM | POA: Diagnosis not present

## 2023-01-08 DIAGNOSIS — Z7901 Long term (current) use of anticoagulants: Secondary | ICD-10-CM | POA: Diagnosis not present

## 2023-01-08 DIAGNOSIS — I2510 Atherosclerotic heart disease of native coronary artery without angina pectoris: Secondary | ICD-10-CM | POA: Diagnosis not present

## 2023-01-08 DIAGNOSIS — I483 Typical atrial flutter: Secondary | ICD-10-CM

## 2023-01-08 DIAGNOSIS — I4892 Unspecified atrial flutter: Secondary | ICD-10-CM | POA: Diagnosis not present

## 2023-01-08 DIAGNOSIS — E1169 Type 2 diabetes mellitus with other specified complication: Secondary | ICD-10-CM | POA: Diagnosis not present

## 2023-01-08 DIAGNOSIS — E785 Hyperlipidemia, unspecified: Secondary | ICD-10-CM | POA: Diagnosis not present

## 2023-01-08 MED ORDER — APIXABAN 5 MG PO TABS: 5.0000 mg | ORAL_TABLET | Freq: Two times a day (BID) | ORAL | 1 refills | Status: DC

## 2023-01-08 NOTE — Telephone Encounter (Signed)
*  STAT* If patient is at the pharmacy, call can be transferred to refill team.   1. Which medications need to be refilled? (please list name of each medication and dose if known) ELIQUIS 5 MG TABS tablet    2. Would you like to learn more about the convenience, safety, & potential cost savings by using the [facility]? N/A     3. Are you open to using the [facility] (Type [facility]. N/A   4. Which pharmacy/location (including street and city if local pharmacy) is medication to be sent to? [id number] - [place], [place] - [address] AT [address] OF [facility] [address]    5. Do they need a 30 day or 90 day supply? Needs enough meds to last until 02/18 appointment.   Patient states he is almost out of meds.

## 2023-01-08 NOTE — Telephone Encounter (Signed)
Pt is requesting Eliquis

## 2023-01-08 NOTE — Telephone Encounter (Signed)
Prescription refill request for Eliquis received. Indication:aflutter Last office visit:upcoming Scr:0.85  9/24 Age:72  Weight:70.3  kg  Prescription refilled

## 2023-02-07 DIAGNOSIS — R8289 Other abnormal findings on cytological and histological examination of urine: Secondary | ICD-10-CM | POA: Diagnosis not present

## 2023-02-07 DIAGNOSIS — N200 Calculus of kidney: Secondary | ICD-10-CM | POA: Diagnosis not present

## 2023-02-07 DIAGNOSIS — Z8546 Personal history of malignant neoplasm of prostate: Secondary | ICD-10-CM | POA: Diagnosis not present

## 2023-02-07 DIAGNOSIS — R310 Gross hematuria: Secondary | ICD-10-CM | POA: Diagnosis not present

## 2023-02-18 ENCOUNTER — Encounter: Payer: Self-pay | Admitting: Neurology

## 2023-02-18 ENCOUNTER — Ambulatory Visit (INDEPENDENT_AMBULATORY_CARE_PROVIDER_SITE_OTHER): Payer: Medicare Other | Admitting: Neurology

## 2023-02-18 VITALS — BP 128/71 | HR 85 | Ht 69.0 in | Wt 152.0 lb

## 2023-02-18 DIAGNOSIS — R292 Abnormal reflex: Secondary | ICD-10-CM | POA: Diagnosis not present

## 2023-02-18 DIAGNOSIS — R29898 Other symptoms and signs involving the musculoskeletal system: Secondary | ICD-10-CM

## 2023-02-18 DIAGNOSIS — Z820 Family history of epilepsy and other diseases of the nervous system: Secondary | ICD-10-CM | POA: Diagnosis not present

## 2023-02-18 DIAGNOSIS — R253 Fasciculation: Secondary | ICD-10-CM | POA: Diagnosis not present

## 2023-02-18 NOTE — Progress Notes (Signed)
[facility]  PATIENT: James Booth DOB: [birth year]  REFERRING DOCTOR OR PCP: Merri Brunette, MD SOURCE: Patient, notes from primary care, lab results.  _________________________________   HISTORICAL  CHIEF COMPLAINT:  Chief Complaint  Patient presents with   Room 10    Pt is here Alone. Pt states that he has having aching in both legs that started in May 2023. Pt states that he has been having muscle weakness in March 23, 2021 that has progressed. Pt states that the twitching in his legs started last year that doesn't happen all of the time, but does occur a lot. Pt states that he has tremors in his hands as well.     HISTORY OF PRESENT ILLNESS:  I had the pleasure of seeing patient, James Booth, at [facility] for neurologic consultation regarding his leg weakness.  He is a 73 yo man who began to note aching pain in the thighs and calves in May 2023.   Of note, he was in a recliner chair a long time as his wife was in the hospital (she died) and he wonders if the poor positioning played a role.    After a couple days, the pain continued but he noticed leg weakness.  He was able to walk but sometimes trembled when he stood up  .   After a couple months he improved.    He started to note twitching in his calves and thighs in 03/23/22.  Less frequently he would note twitching in the right biceps.    He had weakness fluctuating in his legs, but less so than in March 23, 2021.    He has had more anxiety sine his partner died in March 23, 2021.  He was placed on sertraline.  He actually feels better with less weakness and trembling.  He still notes twitching in his legs.     Currently, he notes mild trembling in his hands and continued twitching intermittently in his legs, mostly at night.    He feels slightly weak but is walking well.    No weakness in hands.  He does not do regular exercise but does yard-work.   Weight is stable (-2 pounds in one year).      He has Type 2 NIDDM.     H/o prostate cancer - PSA <0.1 recently.  Labs: 08/2022:   CK, ESR, PSA negative or non-contributary  Vascular risks: Type 2 diabetes, hypertension, hyperlipidemia, history of A-fib  Family history is remarkable for his father having ALS.   REVIEW OF SYSTEMS: Constitutional: No fevers, chills, sweats, or change in appetite Eyes: No visual changes, double vision, eye pain Ear, nose and throat: No hearing loss, ear pain, nasal congestion, sore throat Cardiovascular: No chest pain, palpitations.  He has A-fib and hyperlipidemia. Respiratory:  No shortness of breath at rest or with exertion.   No wheezes GastrointestinaI: No nausea, vomiting, diarrhea, abdominal pain, fecal incontinence Genitourinary:  No dysuria, urinary retention or frequency.  No nocturia.  He has had a prostatectomy Musculoskeletal:  No neck pain, back pain Integumentary: No rash, pruritus, skin lesions Neurological: as above Psychiatric: No depression at this time.  No anxiety Endocrine: No palpitations, diaphoresis, change in appetite, change in weigh or increased thirst.  He has diabetes. Hematologic/Lymphatic:  No anemia, purpura, petechiae. Allergic/Immunologic: No itchy/runny eyes, nasal congestion, recent allergic reactions, rashes  ALLERGIES: Allergies  Allergen Reactions   Aspirin Nausea Only    Acid reflux    HOME MEDICATIONS:  Current Outpatient Medications:  acetaminophen (TYLENOL) 650 MG CR tablet, Take 650 mg by mouth every 8 (eight) hours as needed for pain., Disp: , Rfl:    apixaban (ELIQUIS) 5 MG TABS tablet, Take 1 tablet (5 mg total) by mouth 2 (two) times daily., Disp: 60 tablet, Rfl: 1   guaiFENesin (MUCINEX) 600 MG 12 hr tablet, 2 tablet as needed, Disp: , Rfl:    metFORMIN (GLUCOPHAGE) 500 MG tablet, Take 500 mg by mouth daily., Disp: , Rfl:    olmesartan (BENICAR) 20 MG tablet, Take 20 mg by mouth daily., Disp: , Rfl:    ONE TOUCH ULTRA TEST test strip, , Disp: , Rfl:    OZEMPIC, 1  MG/DOSE, 4 MG/3ML SOPN, Inject into the skin., Disp: , Rfl:    rosuvastatin (CRESTOR) 20 MG tablet, Take 20 mg by mouth daily., Disp: , Rfl:    sertraline (ZOLOFT) 50 MG tablet, Take 50 mg by mouth daily., Disp: , Rfl:   PAST MEDICAL HISTORY: Past Medical History:  Diagnosis Date   Atrial flutter (HCC) 2023   Diabetes mellitus without complication (HCC)    Esophageal stricture    Family history of heart disease    Hypertension    Nephrolithiasis    Prostate cancer (HCC)     PAST SURGICAL HISTORY: Past Surgical History:  Procedure Laterality Date   PROSTATECTOMY     TONSILLECTOMY     VARICOCELECTOMY      FAMILY HISTORY: Family History  Problem Relation Age of Onset   Heart disease Mother    ALS Father    Cancer Sister     SOCIAL HISTORY: Social History   Socioeconomic History   Marital status: Divorced    Spouse name: Not on file   Number of children: 3   Years of education: BS    Highest education level: Not on file  Occupational History   Not on file  Tobacco Use   Smoking status: Never   Smokeless tobacco: Never  Vaping Use   Vaping status: Never Used  Substance and Sexual Activity   Alcohol use: Not Currently   Drug use: No   Sexual activity: Not on file  Other Topics Concern   Not on file  Social History Narrative   Patient has 2 sons and 1 daughter.    Patient is living with girlfriend. James Booth    Patient is divorced.    Patient has a B.S    Patient is right handed.    Patient is working at [employer].            Social Drivers of Corporate investment banker Strain: Not on file  Food Insecurity: Not on file  Transportation Needs: Not on file  Physical Activity: Not on file  Stress: Not on file  Social Connections: Unknown (06/13/2021)   [facility]   Social Network    Social Network: Not on file  Intimate Partner Violence: Unknown (05/05/2021)   Received from [facility], [facility]   HITS    Physically  Hurt: Not on file    Insult or Talk Down To: Not on file    Threaten Physical Harm: Not on file    Scream or Curse: Not on file       PHYSICAL EXAM  Vitals:   02/18/23 1018  BP: 128/71  Pulse: 85  Weight: 152 lb (68.9 kg)  Height: 5\' 9"  (1.753 m)    Body mass index is 22.45 kg/m.   General: The patient  is well-developed and well-nourished and in no acute distress  HEENT:  Head is [place]/AT.  Sclera are anicteric.   Neck: No carotid bruits are noted.  The neck is nontender.  Cardiovascular: The heart has a regular rate and rhythm with a normal S1 and S2. There were no murmurs, gallops or rubs.    Skin: Extremities are without rash or  edema.  Musculoskeletal:  Back is nontender  Neurologic Exam  Mental status: The patient is alert and oriented x 3 at the time of the examination. The patient has apparent normal recent and remote memory, with an apparently normal attention span and concentration ability.   Speech is normal.  Cranial nerves: Extraocular movements are full. Pupils are equal, round, and reactive to light and accomodation.   There is good facial sensation to soft touch bilaterally.Facial strength is normal.  Trapezius and sternocleidomastoid strength is normal. No dysarthria is noted.  The tongue is midline, and the patient has symmetric elevation of the soft palate. No obvious hearing deficits are noted.  Motor:  Muscle bulk is normal.   Tone is normal. Strength is  5 / 5 in all 4 extremities except 4+/5 toe extensors.   Sensory: Sensory testing is intact to pinprick, soft touch and vibration sensation in all 4 extremities.  Coordination: Cerebellar testing reveals good finger-nose-finger and heel-to-shin bilaterally.  Gait and station: Station is normal.   Gait is normal. Tandem gait is mildly wide. Romberg is negative.   Reflexes: Deep tendon reflexes are symmetric in the arms but increased at the knees with spread/crossed adductors.  He also has ankle  clonus, right greater than left..   Plantar responses are flexor.    DIAGNOSTIC DATA (LABS, IMAGING, TESTING) - I reviewed patient records, labs, notes, testing and imaging myself where available.  Lab Results  Component Value Date   WBC 8.8 11/28/2021   HGB 14.6 11/28/2021   HCT 43.0 11/28/2021   MCV 91.3 11/28/2021   PLT 323 11/28/2021      Component Value Date/Time   NA 138 11/28/2021 1802   K 3.9 11/28/2021 1802   CL 101 11/28/2021 1802   CO2 30 11/28/2021 1802   GLUCOSE 162 (H) 11/28/2021 1802   BUN 15 11/28/2021 1802   CREATININE 1.01 11/28/2021 1802   CREATININE 0.79 08/24/2013 1354   CALCIUM 9.6 11/28/2021 1802   PROT 7.5 11/28/2021 1802   ALBUMIN 4.2 11/28/2021 1802   AST 14 (L) 11/28/2021 1802   ALT 17 11/28/2021 1802   ALKPHOS 81 11/28/2021 1802   BILITOT 0.4 11/28/2021 1802   GFRNONAA >60 11/28/2021 1802    Lab Results  Component Value Date   TSH 2.263 08/01/2020       ASSESSMENT AND PLAN  Weakness of both lower extremities - Plan: MR LUMBAR SPINE WO CONTRAST, NCV with EMG(electromyography), MR CERVICAL SPINE WO CONTRAST  Fasciculations - Plan: MR LUMBAR SPINE WO CONTRAST, NCV with EMG(electromyography), MR CERVICAL SPINE WO CONTRAST  Hyperreflexia - Plan: NCV with EMG(electromyography), MR CERVICAL SPINE WO CONTRAST  Family hx of ALS (amyotrophic lateral sclerosis) - Plan: NCV with EMG(electromyography)   In summary, James Booth is a 73 year old man with mild weakness with some fluctuation over the last 2 years and frequent fasciculations.  Additionally, he has a family history of ALS.  On examination, strength was fairly normal (just 4+/5 with toe extension and otherwise 5/5).  However, reflexes were significantly increased with spread at the knees and clonus at the ankles.  Therefore, because  of the abnormality seen on examination as well as the family history of ALS we need to check MRI of the lumbar and cervical spine to rule out significant  radiculopathy, myelopathy.  Additionally, we need to check an NCV/EMG to assess for the possibility of ALS or other abnormality.  Based on the results of the studies, he might require additional testing or referral.  He is advised to continue to be active and to keep his vascular risk factors under the best control possible.  He will return to see [place] in 6 months or sooner based on the results of the studies or if he has new or worsening or neurologic symptoms.  Thank you for asked me to see James Booth.  Please let me know if I can be of further assistance with him or other patients in the future  This visit is part of a comprehensive longitudinal care medical relationship regarding the patients primary diagnosis of weakness and related concerns.    [place] Petron A. Epimenio Foot, MD, [facility] 02/18/2023, 10:33 AM Certified in Neurology, Clinical Neurophysiology, Sleep Medicine and Neuroimaging  [facility] [address], [address] [place], [place] [address] [phone]

## 2023-02-20 ENCOUNTER — Ambulatory Visit (INDEPENDENT_AMBULATORY_CARE_PROVIDER_SITE_OTHER): Payer: Medicare Other | Admitting: Neurology

## 2023-02-20 ENCOUNTER — Ambulatory Visit: Payer: Medicare Other | Admitting: Neurology

## 2023-02-20 VITALS — Ht 69.0 in

## 2023-02-20 DIAGNOSIS — Z820 Family history of epilepsy and other diseases of the nervous system: Secondary | ICD-10-CM | POA: Diagnosis not present

## 2023-02-20 DIAGNOSIS — R29898 Other symptoms and signs involving the musculoskeletal system: Secondary | ICD-10-CM

## 2023-02-20 DIAGNOSIS — R253 Fasciculation: Secondary | ICD-10-CM

## 2023-02-20 DIAGNOSIS — R292 Abnormal reflex: Secondary | ICD-10-CM

## 2023-02-20 NOTE — Progress Notes (Unsigned)
ASSESSMENT AND PLAN  James Booth is a 73 y.o. male   Slow onset mild muscle weakness, muscle fasciculation, denies sensory loss,  Examination showed mild right upper extremity muscle weakness, brisk reflex, bilateral Hoffmann and Babinski signs,  Positive family history, father died of ALS in his 65s  EMG nerve conduction study in January 2025 showed mild subtle chronic neuropathic changes involving bilateral lumbar sacral, right cervical myotomes, there was no thoracic, bulbar involvement to fulfill the diagnosis of motor neuron disease at this point,  Pending evaluation for treatable etiology, including MRI of neuraxis, may consider more extensive laboratory evaluation to rule out paraneoplastic, inflammatory, nutritional deficiency, infectious etiology.  DIAGNOSTIC DATA (LABS, IMAGING, TESTING) - I reviewed patient records, labs, notes, testing and imaging myself where available.   MEDICAL HISTORY: James Booth is a 73 year old right-handed male, referred by Dr.Sater for electrodiagnostic study  History is obtained from the patient and review of electronic medical records. I personally reviewed pertinent available imaging films in PACS.   PMHx of HTN HLD DM-II since 2022 Anxiety Atrial Flutter Kidney Stone Prostate Cancer, s/p prostectomy in 2004, followed by radiation therapy  He underwent very stressful few years, his long-term partner passed away in Jun 21, 2023prior to that, he was the main caregiver of her, he did not notice any difficulty, after stable hospital for 3 days, slept in recliner in 07-20-2023, he woke up 1 morning complains of bilateral anterior thigh deep achy pain, on and off for few months, shortly after that, he began to noticed mild unsteady gait, fatigue tired easily.  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, this has caused a lot of anxiety on him,  He then noticed bilateral hands and lower extremity shaky, improved with  Zoloft treatment, had a diagnosis of eosinophilic esophagitis over the past couple years, occasionally swallowing difficulty, is seeing [facility] GI physician, potential candidate for dilation  Since summer 2024, he also noticed intermittent muscle twitching, involving bilateral calf, thigh muscles, occasionally upper extremity muscles,  Because of his anxiety, medical illness, stress, he has become much less active, sat around a lot, did not noticed upper extremity weakness  EMG nerve conduction study today showed mild chronic neuropathic changes involving bilateral lumbar sacral, and right cervical myotomes.  There was no thoracic or bulbar involvement to fulfill the diagnosis of motor neuron disease, but he was found to have mild right upper extremity proximal muscle weakness, hyperreflexia, bilateral Hoffmann Babinski signs.  In addition, his father suffered ALS died at age 9s,   PHYSICAL EXAM:      02/20/2023    2:28 PM 02/18/2023   10:18 AM 11/28/2021    9:15 PM  Vitals with BMI  Height 5\' 9"  5\' 9"    Weight  152 lbs   BMI  22.44   Systolic  128 127  Diastolic  71 87  Pulse  85 102     PHYSICAL EXAMNIATION:  Gen: NAD, conversant, well nourised, well groomed                     Cardiovascular: Regular rate rhythm, no peripheral edema, warm, nontender. Eyes: Conjunctivae clear without exudates or hemorrhage Neck: Supple, no carotid bruits. Pulmonary: Clear to auscultation bilaterally   NEUROLOGICAL EXAM:  MENTAL STATUS: Speech/cognition: Awake, alert, oriented to history taking and casual conversation CRANIAL NERVES: CN II: Visual fields are full to confrontation. Pupils are round equal and briskly reactive to light. CN  III, IV, VI: extraocular movement are normal. No ptosis. CN V: Facial sensation is intact to light touch CN VII: Face is symmetric with normal eye closure  CN VIII: Hearing is normal to causal conversation. CN IX, X: Phonation is normal. CN XI: Head turning  and shoulder shrug are intact  MOTOR: Occasionally muscle fasciculation in the right triceps, right vastus medialis, mild right shoulder abduction, external rotation weakness,  REFLEXES: Reflexes are 3 and symmetric at the biceps, triceps, knees, and ankles.  Bilateral Hoffmann signs and Babinski signs, SENSORY: Intact to light touch, pinprick and vibratory sensation are intact in fingers and toes.  COORDINATION: There is no trunk or limb dysmetria noted.  GAIT/STANCE: Posture is normal. Gait is steady with normal steps, base, arm swing, and turning. Heel and toe walking are normal. Tandem gait is normal.  Romberg is absent.  REVIEW OF SYSTEMS:  Full 14 system review of systems performed and notable only for as above All other review of systems were negative.   ALLERGIES: Allergies  Allergen Reactions   Aspirin Nausea Only    Acid reflux    HOME MEDICATIONS: Current Outpatient Medications  Medication Sig Dispense Refill   acetaminophen (TYLENOL) 650 MG CR tablet Take 650 mg by mouth every 8 (eight) hours as needed for pain.     apixaban (ELIQUIS) 5 MG TABS tablet Take 1 tablet (5 mg total) by mouth 2 (two) times daily. 60 tablet 1   guaiFENesin (MUCINEX) 600 MG 12 hr tablet 2 tablet as needed     metFORMIN (GLUCOPHAGE) 500 MG tablet Take 500 mg by mouth daily.     olmesartan (BENICAR) 20 MG tablet Take 20 mg by mouth daily.     ONE TOUCH ULTRA TEST test strip      OZEMPIC, 1 MG/DOSE, 4 MG/3ML SOPN Inject into the skin.     rosuvastatin (CRESTOR) 20 MG tablet Take 20 mg by mouth daily.     sertraline (ZOLOFT) 50 MG tablet Take 50 mg by mouth daily.     No current facility-administered medications for this visit.    PAST MEDICAL HISTORY: Past Medical History:  Diagnosis Date   Atrial flutter (HCC) 2023   Diabetes mellitus without complication (HCC)    Esophageal stricture    Family history of heart disease    Hypertension    Nephrolithiasis    Prostate cancer (HCC)      PAST SURGICAL HISTORY: Past Surgical History:  Procedure Laterality Date   PROSTATECTOMY     TONSILLECTOMY     VARICOCELECTOMY      FAMILY HISTORY: Family History  Problem Relation Age of Onset   Heart disease Mother    ALS Father    Cancer Sister     SOCIAL HISTORY: Social History   Socioeconomic History   Marital status: Divorced    Spouse name: Not on file   Number of children: 3   Years of education: BS    Highest education level: Not on file  Occupational History   Not on file  Tobacco Use   Smoking status: Never   Smokeless tobacco: Never  Vaping Use   Vaping status: Never Used  Substance and Sexual Activity   Alcohol use: Not Currently   Drug use: No   Sexual activity: Not on file  Other Topics Concern   Not on file  Social History Narrative   Patient has 2 sons and 1 daughter.    Patient is living with girlfriend. [name]  Patient is divorced.    Patient has a B.S    Patient is right handed.    Patient is working at [employer].            Social Drivers of Corporate investment banker Strain: Not on file  Food Insecurity: Not on file  Transportation Needs: Not on file  Physical Activity: Not on file  Stress: Not on file  Social Connections: Unknown (06/13/2021)   Received from [facility]   Social Network    Social Network: Not on file  Intimate Partner Violence: Unknown (05/05/2021)   Received from [facility]   HITS    Physically Hurt: Not on file    Insult or Talk Down To: Not on file    Threaten Physical Harm: Not on file    Scream or Curse: Not on file      Levert Feinstein, M.D. Ph.D.  [facility] [address] [place], [place] [address] Ph: [phone] Fax: [phone]  CC:  Merri Brunette, MD [address] [place],  [place] [address]  Merri Brunette, MD

## 2023-02-21 ENCOUNTER — Telehealth: Payer: Self-pay | Admitting: Neurology

## 2023-02-21 NOTE — Procedures (Signed)
Full Name: James Booth Gender: Male MRN #: [id number] Date of Birth: 16-Nov-1950    Visit Date: 02/20/2023 14:36 Age: 73 Years Examining Physician: Dr. Levert Feinstein Referring Physician: Dr. Despina Arias Height: 5 feet 9 inch History: 73 year old male presenting with bilateral lower extremity fasciculations, mild weakness  Summary of the test:  Nerve conduction study: Bilateral sural, superficial peroneal, left median and ulnar sensory responses were normal.  Bilateral peroneal to EDB, tibial, left median, ulnar motor responses were normal.  Electromyography: Selected needle examinations were performed at bilateral lower extremity muscles, lumbosacral paraspinals; right upper extremity muscles, right cervical paraspinals; right sternocleidomastoid, and bilateral thoracic paraspinal muscles.  There was subtle chronic neuropathic changes noted at bilateral tibialis anterior, left vastus lateralis, right deltoid, triceps.  There was increased insertional activity, but no obvious spontaneous activity or fasciculation observed.  There was no active denervation at thoracic paraspinal muscles.  There was no clear neuropathic changes noted at right sternocleidomastoid.  Conclusion: This is a mild abnormal study.  There is subtle chronic neuropathic changes involving bilateral lumbar, right cervical myotomes.  But there was no more widespread neuropathic changes involving bulbar or thoracic myotomes observed.  Differentiation diagnoses including early stages of motor neuron disease, also need to rule out lumbar, cervical radiculopathy, inflammatory, infectious, nutritional deficiency, or paraneoplastic syndrome.   Levert Feinstein. M.D. Ph.D.   [facility] [address] [place], [place] [address] Tel: [phone] Fax: [phone]  Verbal informed consent was obtained from the patient, patient was informed of potential risk of procedure, including bruising,  bleeding, hematoma formation, infection, muscle weakness, muscle pain, numbness, among others.        MNC    Nerve / Sites Muscle Latency Ref. Amplitude Ref. Rel Amp Segments Distance Velocity Ref. Area    ms ms mV mV %  cm m/s m/s mVms  L Median - APB     Wrist APB 3.4 <=4.4 9.7 >=4.0 100 Wrist - APB 7   32.9     Upper arm APB 7.7  6.5  67.2 Upper arm - Wrist 26 61 >=49 22.7  L Ulnar - ADM     Wrist ADM 3.0 <=3.3 10.1 >=6.0 100 Wrist - ADM 7   27.8     B.Elbow ADM 5.0  9.4  93.7 B.Elbow - Wrist 12 60 >=49 27.2     A.Elbow ADM 7.8  8.9  94.1 A.Elbow - B.Elbow 18 64 >=49 28.9  L Peroneal - EDB     Ankle EDB 4.7 <=6.5 4.4 >=2.0 100 Ankle - EDB 9   16.1     Fib head EDB 10.4  4.2  95.9 Fib head - Ankle 26 46 >=44 15.3     Pop fossa EDB 12.6  4.8  115 Pop fossa - Fib head 10.6 47 >=44 17.7         Pop fossa - Ankle      R Peroneal - EDB     Ankle EDB 4.6 <=6.5 3.0 >=2.0 100 Ankle - EDB 9   13.4     Fib head EDB 11.0  3.8  125 Fib head - Ankle 26 40 >=44 14.8     Pop fossa EDB 12.9  3.6  96.4 Pop fossa - Fib head 10 52 >=44 15.0         Pop fossa - Ankle      L Tibial - AH     Ankle AH 5.4 <=5.8 15.5 >=  4.0 100 Ankle - AH 9   43.2     Pop fossa AH 12.8  11.6  74.8 Pop fossa - Ankle 36 49 >=41 35.8  R Tibial - AH     Ankle AH 4.8 <=5.8 12.3 >=4.0 100 Ankle - AH 9   36.0     Pop fossa AH 13.1  9.1  74.3 Pop fossa - Ankle 37 45 >=41 29.2                 SNC    Nerve / Sites Rec. Site Peak Lat Ref.  Amp Ref. Segments Distance    ms ms V V  cm  L Sural - Ankle (Calf)     Calf Ankle 3.6 <=4.4 8 >=6 Calf - Ankle 14  R Sural - Ankle (Calf)     Calf Ankle 3.3 <=4.4 8 >=6 Calf - Ankle 14  L Superficial peroneal - Ankle     Lat leg Ankle 3.9 <=4.4 6 >=6 Lat leg - Ankle 14  R Superficial peroneal - Ankle     Lat leg Ankle 3.8 <=4.4 6 >=6 Lat leg - Ankle 14  L Median - Orthodromic (Dig II, Mid palm)     Dig II Wrist 3.1 <=3.4 12 >=10 Dig II - Wrist 13  L Ulnar - Orthodromic, (Dig V, Mid  palm)     Dig V Wrist 3.0 <=3.1 6 >=5 Dig V - Wrist 76                 F  Wave    Nerve F Lat Ref.   ms ms  L Tibial - AH 50.4 <=56.0  R Tibial - AH 50.8 <=56.0  L Ulnar - ADM 27.3 <=32.0           EMG Summary Table    Spontaneous MUAP Recruitment  Muscle IA Fib PSW Fasc Other Amp Dur. Poly Pattern  R. Tibialis anterior Increased None None None _______ Increased Increased 1+ Reduced  R. Vastus lateralis Normal None None None _______ Normal Normal Normal Normal  R. Gastrocnemius (Medial head) Normal None None None _______ Normal Normal Normal Normal  R. Tibialis posterior Normal None None None _______ Normal Normal Normal Normal  L. Tibialis anterior Increased None None None _______ Increased Increased 1+ Reduced  L. Tibialis posterior Normal None None None _______ Normal Normal Normal Normal  L. Gastrocnemius (Medial head) Normal None None None _______ Normal Normal Normal Normal  L. Vastus lateralis Normal None None None _______ Normal Normal Normal Reduced  R. Lumbar paraspinals (low) Normal None None None _______ Normal Normal Normal Normal  R. Lumbar paraspinals (mid) Normal None None None _______ Normal Normal Normal Normal  L. Lumbar paraspinals (low) Normal None None None _______ Normal Normal Normal Normal  L. Lumbar paraspinals (mid) Normal None None None _______ Normal Normal Normal Normal  R. Thoracic paraspinals Normal None None None _______ Normal Normal Normal Normal  L. Thoracic paraspinals Normal None None None _______ Normal Normal Normal Normal  R. Cervical paraspinals Normal None None None _______ Normal Normal Normal Normal  R. Pronator teres Normal None None None _______ Normal Normal Normal Normal  R. Biceps brachii Normal None None None _______ Normal Normal Normal Normal  R. Deltoid Normal None None None _______ Normal Normal Normal Reduced  R. Triceps brachii Normal None None None _______ Normal Normal Normal Reduced  R. First dorsal interosseous Normal None  None None _______ Normal Normal Normal Normal  R.  Sternocleidomastoid   Normal None None None _______ Normal Normal Normal Normal

## 2023-02-21 NOTE — Telephone Encounter (Signed)
medicare NPR sent to GI [phone] 

## 2023-02-24 NOTE — Progress Notes (Signed)
EMG nerve conduction study report is under procedure tab

## 2023-02-27 DIAGNOSIS — E119 Type 2 diabetes mellitus without complications: Secondary | ICD-10-CM | POA: Diagnosis not present

## 2023-02-27 DIAGNOSIS — H524 Presbyopia: Secondary | ICD-10-CM | POA: Diagnosis not present

## 2023-02-28 ENCOUNTER — Encounter: Payer: Self-pay | Admitting: Neurology

## 2023-03-06 DIAGNOSIS — K7689 Other specified diseases of liver: Secondary | ICD-10-CM | POA: Diagnosis not present

## 2023-03-06 DIAGNOSIS — K5730 Diverticulosis of large intestine without perforation or abscess without bleeding: Secondary | ICD-10-CM | POA: Diagnosis not present

## 2023-03-06 DIAGNOSIS — R310 Gross hematuria: Secondary | ICD-10-CM | POA: Diagnosis not present

## 2023-03-06 DIAGNOSIS — N200 Calculus of kidney: Secondary | ICD-10-CM | POA: Diagnosis not present

## 2023-03-06 DIAGNOSIS — N281 Cyst of kidney, acquired: Secondary | ICD-10-CM | POA: Diagnosis not present

## 2023-03-10 ENCOUNTER — Ambulatory Visit
Admission: RE | Admit: 2023-03-10 | Discharge: 2023-03-10 | Disposition: A | Discharge status: Home / Self Care | Payer: Medicare Other | Source: Ambulatory Visit | Attending: Neurology | Admitting: Neurology

## 2023-03-10 DIAGNOSIS — R292 Abnormal reflex: Secondary | ICD-10-CM

## 2023-03-10 DIAGNOSIS — R29898 Other symptoms and signs involving the musculoskeletal system: Secondary | ICD-10-CM

## 2023-03-10 DIAGNOSIS — R253 Fasciculation: Secondary | ICD-10-CM

## 2023-03-11 ENCOUNTER — Telehealth: Payer: Self-pay

## 2023-03-11 NOTE — Telephone Encounter (Addendum)
 Called pt and let him know his MRI Results of the Cervical, and Lumbar per Dr. Samara Crest.  Pt stated that he does have his PCP watching for his Cyst on his Liver and Kidney.  Pt states that he would like to know Dr. Thom Fleeting game plan and wether or not he needs surgery or to come back in to see Dr. Godwin Lat. Pt states that his fingers on both of his hands have been having numbness.   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  There were also incidental findings of cyst in your liver and kidney. He should follow up with PCP regarding the cysts.

## 2023-03-11 NOTE — Progress Notes (Signed)
 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  There were also incidental findings of cyst in your liver and kidney. He should follow up with PCP regarding the cysts.

## 2023-03-11 NOTE — Progress Notes (Signed)
 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

## 2023-03-12 NOTE — Telephone Encounter (Signed)
Called pt. Relayed Dr. Bonnita Hollow message. He verbalized understanding and appreciation.

## 2023-03-14 DIAGNOSIS — H25813 Combined forms of age-related cataract, bilateral: Secondary | ICD-10-CM | POA: Diagnosis not present

## 2023-03-19 ENCOUNTER — Encounter: Payer: Self-pay | Admitting: Cardiovascular Disease

## 2023-03-19 ENCOUNTER — Ambulatory Visit: Payer: Medicare Other | Attending: Cardiovascular Disease | Admitting: Cardiovascular Disease

## 2023-03-19 VITALS — BP 127/77 | HR 75 | Ht 69.0 in | Wt 151.0 lb

## 2023-03-19 DIAGNOSIS — I10 Essential (primary) hypertension: Secondary | ICD-10-CM

## 2023-03-19 DIAGNOSIS — I483 Typical atrial flutter: Secondary | ICD-10-CM | POA: Diagnosis not present

## 2023-03-19 DIAGNOSIS — E782 Mixed hyperlipidemia: Secondary | ICD-10-CM

## 2023-03-19 NOTE — Progress Notes (Signed)
03/19/2023 James Booth   Jun 20, 1950  [id number]  Primary Physician James Brunette, MD Primary Cardiologist: James Gess MD James Booth, [place]  HPI:  James Booth is a 73 y.o.  divorced Caucasian male father of 3, who  lives with his long-time girlfriend. 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 I saw him in the office 10/11/2021.  His risk factors include family history with a mother who died at age 78 of myocardial infarction but otherwise is negative other than recently diagnosed type 2 diabetes and hypertension. He was never had a heart attack or stroke. At that time addendum a Myoview stress test was normal and his pain resolved. He does have a remote history of prostate cancer status post prostatectomy in 2004 by Dr. Bjorn Booth. He saw Dr. Merri Booth recently for primary care who noted some PVCs and referred him back for further evaluation.   I did perform a Myoview stress test on him back in 2010 which was low risk nonischemic.  He is totally asymptomatic.  He did have a coronary calcium score performed 08/13/2019 which was essentially normal with a calcium score of only 3.   He did undergo DC cardioversion at [facility] on 08/01/20 because of PA flutter.  He said no recurrent episodes.  He is on Eliquis oral anticoagulation.    Since I saw him a year and a half ago he is remained stable.  He is relatively active.  He has had no recurrence of a flutter.  He denies chest pain or shortness of breath.   Current Meds  Medication Sig   acetaminophen (TYLENOL) 650 MG CR tablet Take 650 mg by mouth every 8 (eight) hours as needed for pain.   apixaban (ELIQUIS) 5 MG TABS tablet Take 1 tablet (5 mg total) by mouth 2 (two) times daily.   guaiFENesin (MUCINEX) 600 MG 12 hr tablet 2 tablet as needed   metFORMIN (GLUCOPHAGE) 500 MG tablet Take 500 mg by mouth daily.   olmesartan (BENICAR) 20 MG tablet Take 20 mg by mouth daily.   ONE TOUCH ULTRA TEST test  strip    OZEMPIC, 1 MG/DOSE, 4 MG/3ML SOPN Inject into the skin.   rosuvastatin (CRESTOR) 20 MG tablet Take 20 mg by mouth daily.   sertraline (ZOLOFT) 50 MG tablet Take 50 mg by mouth daily.     Allergies  Allergen Reactions   Aspirin Nausea Only    Acid reflux    Social History   Socioeconomic History   Marital status: Divorced    Spouse name: Not on file   Number of children: 3   Years of education: BS    Highest education level: Not on file  Occupational History   Not on file  Tobacco Use   Smoking status: Never   Smokeless tobacco: Never  Vaping Use   Vaping status: Never Used  Substance and Sexual Activity   Alcohol use: Not Currently   Drug use: No   Sexual activity: Not Currently    Comment: single  Other Topics Concern   Not on file  Social History Narrative   Patient has 2 sons and 1 daughter.    Patient is living with girlfriend. [name]    Patient is divorced.    Patient has a B.S    Patient is right handed.    Patient is working at [employer].  Social Drivers of Corporate investment banker Strain: Not on file  Food Insecurity: Not on file  Transportation Needs: Not on file  Physical Activity: Not on file  Stress: Not on file  Social Connections: Unknown (06/13/2021)   Received from [facility], [facility]   Social Network    Social Network: Not on file  Intimate Partner Violence: Unknown (05/05/2021)   Received [facility], [facility]   HITS    Physically Hurt: Not on file    Insult or Talk Down To: Not on file    Threaten Physical Harm: Not on file    Scream or Curse: Not on file     Review of Systems: General: negative for chills, fever, night sweats or weight changes.  Cardiovascular: negative for chest pain, dyspnea on exertion, edema, orthopnea, palpitations, paroxysmal nocturnal dyspnea or shortness of breath Dermatological: negative for rash Respiratory: negative for cough or wheezing Urologic: negative for  hematuria Abdominal: negative for nausea, vomiting, diarrhea, bright red blood per rectum, melena, or hematemesis Neurologic: negative for visual changes, syncope, or dizziness All other systems reviewed and are otherwise negative except as noted above.    Blood pressure 127/77, pulse 75, height 5\' 9"  (1.753 m), weight 151 lb (68.5 kg), SpO2 99%.  General appearance: alert and no distress Neck: no adenopathy, no carotid bruit, no JVD, supple, symmetrical, trachea midline, and thyroid not enlarged, symmetric, no tenderness/mass/nodules Lungs: clear to auscultation bilaterally Heart: regular rate and rhythm, S1, S2 normal, no murmur, click, rub or gallop Extremities: extremities normal, atraumatic, no cyanosis or edema Pulses: 2+ and symmetric Skin: Skin color, texture, turgor normal. No rashes or lesions Neurologic: Grossly normal  EKG EKG Interpretation Date/Time:  Tuesday March 19 2023 11:49:29 EST Ventricular Rate:  75 PR Interval:  182 QRS Duration:  88 QT Interval:  372 QTC Calculation: 415 R Axis:   -22  Text Interpretation: Normal sinus rhythm Possible Left atrial enlargement When compared with ECG of 28-Nov-2021 19:58, PREVIOUS ECG IS PRESENT Confirmed by James Booth [phone]) on 03/19/2023 12:07:26 PM    ASSESSMENT AND PLAN:   Essential hypertension History of essential hypertension her blood pressure measured today at 127/77.  He is on Benicar.  Hyperlipidemia History of lipidemia on statin therapy with lipid profile performed 12/04/2022 revealing total cholesterol 99, LDL 34 and HDL 54.  Typical atrial flutter (HCC) History of paroxysmal atrial flutter status post cardioversion in [facility] 7//22.  He has not had recurrence.  He is on Eliquis.     James Gess MD FACP,FACC,FAHA, [facility] 03/19/2023 12:12 PM

## 2023-03-19 NOTE — Patient Instructions (Signed)

## 2023-03-19 NOTE — Assessment & Plan Note (Signed)
History of lipidemia on statin therapy with lipid profile performed 12/04/2022 revealing total cholesterol 99, LDL 34 and HDL 54.

## 2023-03-19 NOTE — Assessment & Plan Note (Signed)
History of paroxysmal atrial flutter status post cardioversion in [facility] 7//22.  He has not had recurrence.  He is on Eliquis.

## 2023-03-19 NOTE — Assessment & Plan Note (Signed)
History of essential hypertension her blood pressure measured today at 127/77.  He is on Benicar.

## 2023-03-21 DIAGNOSIS — R310 Gross hematuria: Secondary | ICD-10-CM | POA: Diagnosis not present

## 2023-03-21 DIAGNOSIS — N200 Calculus of kidney: Secondary | ICD-10-CM | POA: Diagnosis not present

## 2023-03-21 DIAGNOSIS — Z8546 Personal history of malignant neoplasm of prostate: Secondary | ICD-10-CM | POA: Diagnosis not present

## 2023-04-04 ENCOUNTER — Other Ambulatory Visit: Payer: Self-pay | Admitting: Cardiovascular Disease

## 2023-04-04 DIAGNOSIS — I483 Typical atrial flutter: Secondary | ICD-10-CM

## 2023-04-04 DIAGNOSIS — E1136 Type 2 diabetes mellitus with diabetic cataract: Secondary | ICD-10-CM | POA: Diagnosis not present

## 2023-04-04 DIAGNOSIS — H25811 Combined forms of age-related cataract, right eye: Secondary | ICD-10-CM | POA: Diagnosis not present

## 2023-04-04 NOTE — Telephone Encounter (Signed)
 Prescription refill request for Eliquis received.  Indication: afib  Last office visit: Allyson Sabal 03/19/2023 Scr: 0.85, 10/19/2023 Age: 73 yo  Weight: 68.5 kg   Refill sent.

## 2023-04-18 DIAGNOSIS — I4892 Unspecified atrial flutter: Secondary | ICD-10-CM | POA: Diagnosis not present

## 2023-04-18 DIAGNOSIS — E1136 Type 2 diabetes mellitus with diabetic cataract: Secondary | ICD-10-CM | POA: Diagnosis not present

## 2023-04-18 DIAGNOSIS — H25812 Combined forms of age-related cataract, left eye: Secondary | ICD-10-CM | POA: Diagnosis not present

## 2023-05-09 DIAGNOSIS — Z7901 Long term (current) use of anticoagulants: Secondary | ICD-10-CM | POA: Diagnosis not present

## 2023-05-09 DIAGNOSIS — E1169 Type 2 diabetes mellitus with other specified complication: Secondary | ICD-10-CM | POA: Diagnosis not present

## 2023-05-09 DIAGNOSIS — E785 Hyperlipidemia, unspecified: Secondary | ICD-10-CM | POA: Diagnosis not present

## 2023-05-09 DIAGNOSIS — I2510 Atherosclerotic heart disease of native coronary artery without angina pectoris: Secondary | ICD-10-CM | POA: Diagnosis not present

## 2023-05-09 DIAGNOSIS — I10 Essential (primary) hypertension: Secondary | ICD-10-CM | POA: Diagnosis not present

## 2023-05-16 DIAGNOSIS — I2510 Atherosclerotic heart disease of native coronary artery without angina pectoris: Secondary | ICD-10-CM | POA: Diagnosis not present

## 2023-05-16 DIAGNOSIS — I10 Essential (primary) hypertension: Secondary | ICD-10-CM | POA: Diagnosis not present

## 2023-05-16 DIAGNOSIS — E1169 Type 2 diabetes mellitus with other specified complication: Secondary | ICD-10-CM | POA: Diagnosis not present

## 2023-05-16 DIAGNOSIS — E785 Hyperlipidemia, unspecified: Secondary | ICD-10-CM | POA: Diagnosis not present

## 2023-05-16 DIAGNOSIS — Z7901 Long term (current) use of anticoagulants: Secondary | ICD-10-CM | POA: Diagnosis not present

## 2023-05-16 DIAGNOSIS — I4892 Unspecified atrial flutter: Secondary | ICD-10-CM | POA: Diagnosis not present

## 2023-05-16 DIAGNOSIS — I2584 Coronary atherosclerosis due to calcified coronary lesion: Secondary | ICD-10-CM | POA: Diagnosis not present

## 2023-05-22 DIAGNOSIS — L738 Other specified follicular disorders: Secondary | ICD-10-CM | POA: Diagnosis not present

## 2023-05-22 DIAGNOSIS — L821 Other seborrheic keratosis: Secondary | ICD-10-CM | POA: Diagnosis not present

## 2023-05-22 DIAGNOSIS — L080 Pyoderma: Secondary | ICD-10-CM | POA: Diagnosis not present

## 2023-05-22 DIAGNOSIS — D485 Neoplasm of uncertain behavior of skin: Secondary | ICD-10-CM | POA: Diagnosis not present

## 2023-05-22 DIAGNOSIS — Z85828 Personal history of other malignant neoplasm of skin: Secondary | ICD-10-CM | POA: Diagnosis not present

## 2023-05-27 DIAGNOSIS — Z85828 Personal history of other malignant neoplasm of skin: Secondary | ICD-10-CM | POA: Insufficient documentation

## 2023-05-27 DIAGNOSIS — R1310 Dysphagia, unspecified: Secondary | ICD-10-CM | POA: Insufficient documentation

## 2023-05-27 DIAGNOSIS — Z8249 Family history of ischemic heart disease and other diseases of the circulatory system: Secondary | ICD-10-CM | POA: Insufficient documentation

## 2023-05-27 DIAGNOSIS — K219 Gastro-esophageal reflux disease without esophagitis: Secondary | ICD-10-CM | POA: Insufficient documentation

## 2023-05-27 DIAGNOSIS — R29898 Other symptoms and signs involving the musculoskeletal system: Secondary | ICD-10-CM | POA: Insufficient documentation

## 2023-05-27 DIAGNOSIS — K200 Eosinophilic esophagitis: Secondary | ICD-10-CM | POA: Diagnosis not present

## 2023-05-27 DIAGNOSIS — Z860101 Personal history of adenomatous and serrated colon polyps: Secondary | ICD-10-CM | POA: Insufficient documentation

## 2023-05-27 DIAGNOSIS — N200 Calculus of kidney: Secondary | ICD-10-CM | POA: Insufficient documentation

## 2023-05-27 DIAGNOSIS — Z923 Personal history of irradiation: Secondary | ICD-10-CM | POA: Insufficient documentation

## 2023-05-27 DIAGNOSIS — I493 Ventricular premature depolarization: Secondary | ICD-10-CM | POA: Insufficient documentation

## 2023-05-27 DIAGNOSIS — H9190 Unspecified hearing loss, unspecified ear: Secondary | ICD-10-CM | POA: Insufficient documentation

## 2023-05-27 DIAGNOSIS — Z789 Other specified health status: Secondary | ICD-10-CM | POA: Insufficient documentation

## 2023-05-27 DIAGNOSIS — I2510 Atherosclerotic heart disease of native coronary artery without angina pectoris: Secondary | ICD-10-CM | POA: Insufficient documentation

## 2023-05-27 DIAGNOSIS — R1030 Lower abdominal pain, unspecified: Secondary | ICD-10-CM | POA: Insufficient documentation

## 2023-05-27 DIAGNOSIS — Z7901 Long term (current) use of anticoagulants: Secondary | ICD-10-CM | POA: Insufficient documentation

## 2023-05-27 DIAGNOSIS — N529 Male erectile dysfunction, unspecified: Secondary | ICD-10-CM | POA: Insufficient documentation

## 2023-05-27 DIAGNOSIS — J309 Allergic rhinitis, unspecified: Secondary | ICD-10-CM | POA: Insufficient documentation

## 2023-05-27 DIAGNOSIS — F411 Generalized anxiety disorder: Secondary | ICD-10-CM | POA: Insufficient documentation

## 2023-05-27 DIAGNOSIS — K222 Esophageal obstruction: Secondary | ICD-10-CM | POA: Diagnosis not present

## 2023-05-27 DIAGNOSIS — E119 Type 2 diabetes mellitus without complications: Secondary | ICD-10-CM | POA: Insufficient documentation

## 2023-05-27 DIAGNOSIS — R251 Tremor, unspecified: Secondary | ICD-10-CM | POA: Insufficient documentation

## 2023-05-29 ENCOUNTER — Other Ambulatory Visit: Payer: Self-pay | Admitting: Cardiovascular Disease

## 2023-05-29 DIAGNOSIS — I483 Typical atrial flutter: Secondary | ICD-10-CM

## 2023-05-29 NOTE — Telephone Encounter (Signed)
 Eliquis  5mg  refill request received. Patient is 73 years old, weight-68.5kg, Crea-0.96 on 05/09/23 via [employer] from Dr. Schuyler Custard, Diagnosis-Aflutter, and last seen by Dr. Katheryne Pane on 03/19/23. Dose is appropriate based on dosing criteria.

## 2023-08-12 ENCOUNTER — Telehealth: Payer: Self-pay | Admitting: Neurology

## 2023-08-12 NOTE — Telephone Encounter (Signed)
 Called pt at [phone]. Confirmed he did not fall and hit head. Has increased head pressure, experiencing brain fog, off balance intermittently. Sx started about a month ago. Sleeps more often. Sleeps about 6-7 hr of sleep at night. Naps 1-2 x per day.  No new meds started recently. No illness/infection recently. Scheduled appt for 08/19/23 at 9am with Dr. Vear. Aware to proceed to ED or urgent care if he develops new/worsening symptoms for immediate evaluation/treatment.

## 2023-08-12 NOTE — Telephone Encounter (Signed)
 Pt feels like he has had a concussion, but he has not hit his head.  Pt states for about 3-4 weeks he has pressure in his head, wants to always sleeep, is always tired and has pressure in his head.  Pt has not been to ED or pcp, he thought this would go away, please call.

## 2023-08-19 ENCOUNTER — Ambulatory Visit (INDEPENDENT_AMBULATORY_CARE_PROVIDER_SITE_OTHER): Admitting: Neurology

## 2023-08-19 ENCOUNTER — Encounter: Payer: Self-pay | Admitting: Neurology

## 2023-08-19 VITALS — BP 128/77 | HR 83 | Ht 68.0 in | Wt 154.5 lb

## 2023-08-19 DIAGNOSIS — E118 Type 2 diabetes mellitus with unspecified complications: Secondary | ICD-10-CM | POA: Insufficient documentation

## 2023-08-19 DIAGNOSIS — R253 Fasciculation: Secondary | ICD-10-CM | POA: Diagnosis not present

## 2023-08-19 DIAGNOSIS — I2510 Atherosclerotic heart disease of native coronary artery without angina pectoris: Secondary | ICD-10-CM | POA: Insufficient documentation

## 2023-08-19 DIAGNOSIS — R292 Abnormal reflex: Secondary | ICD-10-CM

## 2023-08-19 DIAGNOSIS — Z820 Family history of epilepsy and other diseases of the nervous system: Secondary | ICD-10-CM | POA: Diagnosis not present

## 2023-08-19 DIAGNOSIS — R4189 Other symptoms and signs involving cognitive functions and awareness: Secondary | ICD-10-CM | POA: Diagnosis not present

## 2023-08-19 DIAGNOSIS — R29898 Other symptoms and signs involving the musculoskeletal system: Secondary | ICD-10-CM

## 2023-08-19 DIAGNOSIS — F418 Other specified anxiety disorders: Secondary | ICD-10-CM | POA: Diagnosis not present

## 2023-08-19 NOTE — Progress Notes (Signed)
 [facility]  PATIENT: James Booth DOB: 1950-08-18  REFERRING DOCTOR OR PCP: Ryan Hives, MD SOURCE: Patient, notes from primary care, lab results.  _________________________________   HISTORICAL  CHIEF COMPLAINT:  Chief Complaint  Patient presents with   RM11/WEAKNESS OF BOTH EXTREMITIES    Pt is here Alone. Pt states that he has been having problems with his head feeling like a balloon. Pt states that he has pressure in his head. Pt states that his head feels tight as well as his ears feel tight. Pt states that he has a lot of fatigue. Pt states that he has a pain in the back of his head. Pt states that he wants to make sure everything is okay. Pt states that he still has twitching in both of his calves. Pt states that he sometimes has dizziness.    Memory Loss    MOCA today 26/30. Yr of education; 18    HISTORY OF PRESENT ILLNESS:  James Booth is a 73 y.o. man with leg weakness.   UPDATE 08/19/2023 Since the last visit, he had NCV/EMG showing This is a mild abnormal study. There is subtle chronic neuropathic changes involving bilateral lumbar, right cervical myotomes. But there was no more widespread neuropathic changes involving bulbar or thoracic myotomes observed. Differentiation diagnoses including early stages of motor neuron disease, also need to rule out lumbar, cervical radiculopathy, inflammatory, infectious, nutritional deficiency, or paraneoplastic syndrome.   He continues to note calf and sometimes quad fasciculations.   No further weakness.    He has some neck pain and back pain but worse pain in hips.   After yard work one day he felt extremely tired.   He tries to exercise now and then with squats and balance    The trembling is improved.    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  Could affect right C5, bilateral C6 and bilateral L4 nerve roots  He feels foggy  headed at times.   He is more concerned about cognitive issues than others .  He has had more anxiety sine his partner died in 14-Sep-2021.  He was placed on sertraline.  He actually feels better with less weakness and trembling.  Twitching in legs seems  worse when anxious.   He sleeps well 7-8 hours.   He does take a nap in the late mornings or afternoons just sitting up    He does not think he snores.   Never wakes up gasping.   EPWORTH SLEEPINESS SCALE  On a scale of 0 - 3 what is the chance of dozing:  Sitting and Reading:   1 Watching TV:    1 Sitting inactive in a public place: 0 Passenger in car for one hour: 0 Lying down to rest in the afternoon: 3 Sitting and talking to someone: 0 Sitting quietly after lunch:  3 In a car, stopped in traffic:  0  Total (out of 24):    8/30       08/19/2023    9:48 AM  Montreal Cognitive Assessment   Visuospatial/ Executive (0/5) 5  Naming (0/3) 3  Attention: Read list of digits (0/2) 2  Attention: Read list of letters (0/1) 1  Attention: Serial 7 subtraction starting at 100 (0/3) 1  Language: Repeat phrase (0/2) 2  Language : Fluency (0/1) 0  Abstraction (0/2) 2  Delayed Recall (0/5) 4  Orientation (0/6) 6  Total  26  Adjusted Score (based on education) 26       History of weakness: He  began to note aching pain in the thighs and calves in May 2023.   Of note, he was in a recliner chair a long time as his wife was in the hospital (she died) and he wonders if the poor positioning played a role.    After a couple days, the pain continued but he noticed leg weakness.  He was able to walk but sometimes trembled when he stood up  .   After a couple months he improved.    He started to note twitching in his calves and thighs in 2024.  Less frequently he would note twitching in the right biceps.    He had weakness fluctuating in his legs, but less so than in 2023.     Weight is stable (-2 pounds in one year).      He has Type 2 NIDDM.    H/o  prostate cancer - PSA <0.1 recently.  Labs: 08/2022:   CK, ESR, PSA negative or non-contributary  Vascular risks: Type 2 diabetes, hypertension, hyperlipidemia, history of A-fib  Family history is remarkable for his father having ALS.   REVIEW OF SYSTEMS: Constitutional: No fevers, chills, sweats, or change in appetite Eyes: No visual changes, double vision, eye pain Ear, nose and throat: No hearing loss, ear pain, nasal congestion, sore throat Cardiovascular: No chest pain, palpitations.  He has A-fib and hyperlipidemia. Respiratory:  No shortness of breath at rest or with exertion.   No wheezes GastrointestinaI: No nausea, vomiting, diarrhea, abdominal pain, fecal incontinence Genitourinary:  No dysuria, urinary retention or frequency.  No nocturia.  He has had a prostatectomy Musculoskeletal:  No neck pain, back pain Integumentary: No rash, pruritus, skin lesions Neurological: as above Psychiatric: No depression at this time.  No anxiety Endocrine: No palpitations, diaphoresis, change in appetite, change in weigh or increased thirst.  He has diabetes. Hematologic/Lymphatic:  No anemia, purpura, petechiae. Allergic/Immunologic: No itchy/runny eyes, nasal congestion, recent allergic reactions, rashes  ALLERGIES: Allergies  Allergen Reactions   Aspirin Nausea Only    Acid reflux    HOME MEDICATIONS:  Current Outpatient Medications:    acetaminophen (TYLENOL) 650 MG CR tablet, Take 650 mg by mouth every 8 (eight) hours as needed for pain., Disp: , Rfl:    apixaban  (ELIQUIS ) 5 MG TABS tablet, TAKE 1 TABLET BY MOUTH TWICE A DAY, Disp: 60 tablet, Rfl: 5   metFORMIN (GLUCOPHAGE) 500 MG tablet, Take 500 mg by mouth daily., Disp: , Rfl:    olmesartan (BENICAR) 20 MG tablet, Take 20 mg by mouth daily., Disp: , Rfl:    ONE TOUCH ULTRA TEST test strip, , Disp: , Rfl:    OZEMPIC, 1 MG/DOSE, 4 MG/3ML SOPN, Inject into the skin., Disp: , Rfl:    rosuvastatin (CRESTOR) 20 MG tablet, Take  20 mg by mouth daily., Disp: , Rfl:    sertraline (ZOLOFT) 50 MG tablet, Take 50 mg by mouth daily., Disp: , Rfl:    guaiFENesin (MUCINEX) 600 MG 12 hr tablet, 2 tablet as needed (Patient not taking: Reported on 08/19/2023), Disp: , Rfl:   PAST MEDICAL HISTORY: Past Medical History:  Diagnosis Date   Atrial flutter (HCC) 2023   Diabetes mellitus without complication (HCC)    Esophageal stricture    Family history of heart disease    Hypertension    Nephrolithiasis    Prostate cancer (HCC)  PAST SURGICAL HISTORY: Past Surgical History:  Procedure Laterality Date   CATARACT EXTRACTION Bilateral    PROSTATECTOMY     TONSILLECTOMY     VARICOCELECTOMY      FAMILY HISTORY: Family History  Problem Relation Age of Onset   Heart disease Mother    ALS Father    Cancer Sister     SOCIAL HISTORY: Social History   Socioeconomic History   Marital status: Divorced    Spouse name: Not on file   Number of children: 3   Years of education: BS    Highest education level: Not on file  Occupational History   Not on file  Tobacco Use   Smoking status: Never   Smokeless tobacco: Never  Vaping Use   Vaping status: Never Used  Substance and Sexual Activity   Alcohol use: Not Currently   Drug use: No   Sexual activity: Not Currently    Comment: single  Other Topics Concern   Not on file  Social History Narrative   Patient has 2 sons and 1 daughter.    Patient is living with girlfriend. Reena    Patient is divorced.    Patient has a B.S    Patient is right handed.    Patient is working at [employer].            Social Drivers of Corporate investment banker Strain: Not on file  Food Insecurity: Not on file  Transportation Needs: Not on file  Physical Activity: Not on file  Stress: Not on file  Social Connections: Unknown (06/13/2021)   [facility] from [facility]   Social Network    Social Network: Not on file  Intimate Partner Violence: Unknown (05/05/2021)   Received  from [facility]   HITS    Physically Hurt: Not on file    Insult or Talk Down To: Not on file    Threaten Physical Harm: Not on file    Scream or Curse: Not on file       PHYSICAL EXAM  Vitals:   08/19/23 0912  BP: 128/77  Pulse: 83  SpO2: 99%  Weight: 154 lb 8 oz (70.1 kg)  Height: 5' 8 (1.727 m)    Body mass index is 23.49 kg/m.   General: The patient is well-developed and well-nourished and in no acute distress  HEENT:  Head is [place]/AT.  Sclera are anicteric.   Neck: No carotid bruits are noted.  The neck is nontender.  Cardiovascular: The heart has a regular rate and rhythm with a normal S1 and S2. There were no murmurs, gallops or rubs.    Skin: Extremities are without rash or  edema.  Musculoskeletal:  Back is nontender  Neurologic Exam  Mental status: The patient is alert and oriented x 3 at the time of the examination. The patient has apparent normal recent and remote memory, with an apparently normal attention span and concentration ability.   Speech is normal.  Cranial nerves: Extraocular movements are full. Pupils are equal, round, and reactive to light and accomodation.   There is good facial sensation to soft touch bilaterally.Facial strength is normal.  Trapezius and sternocleidomastoid strength is normal. No dysarthria is noted.  The tongue is midline, and the patient has symmetric elevation of the soft palate. No obvious hearing deficits are noted.  Motor:  Muscle bulk is normal.   Tone is normal. Strength is  5 / 5 in all 4 extremities except 4+/5 toe extensors.  Sensory: Sensory testing is intact to pinprick, soft touch and vibration sensation in all 4 extremities.  Coordination: Cerebellar testing reveals good finger-nose-finger and heel-to-shin bilaterally.  Gait and station: Station is normal.   Gait is normal. Tandem gait is mildly wide. Romberg is negative.   Reflexes: Deep tendon reflexes are symmetric in the arms but increased at the  knees with spread/crossed adductors.  He also has ankle clonus, right greater than left..   Plantar responses are flexor.    DIAGNOSTIC DATA (LABS, IMAGING, TESTING) - I reviewed patient records, labs, notes, testing and imaging myself where available.  Lab Results  Component Value Date   WBC 8.8 11/28/2021   HGB 14.6 11/28/2021   HCT 43.0 11/28/2021   MCV 91.3 11/28/2021   PLT 323 11/28/2021      Component Value Date/Time   NA 138 11/28/2021 1802   K 3.9 11/28/2021 1802   CL 101 11/28/2021 1802   CO2 30 11/28/2021 1802   GLUCOSE 162 (H) 11/28/2021 1802   BUN 15 11/28/2021 1802   CREATININE 1.01 11/28/2021 1802   CREATININE 0.79 08/24/2013 1354   CALCIUM 9.6 11/28/2021 1802   PROT 7.5 11/28/2021 1802   ALBUMIN 4.2 11/28/2021 1802   AST 14 (L) 11/28/2021 1802   ALT 17 11/28/2021 1802   ALKPHOS 81 11/28/2021 1802   BILITOT 0.4 11/28/2021 1802   GFRNONAA >60 11/28/2021 1802    Lab Results  Component Value Date   TSH 2.263 08/01/2020       ASSESSMENT AND PLAN  Weakness of both lower extremities  Hyperreflexia  Fasciculations  Family hx of ALS (amyotrophic lateral sclerosis)  Depression with anxiety  Subjective cognitive impairment   Strength is stable.  Did not have evidence of MND/ALS.   Likely has some radiculopathy but no spinal stenosis.  Subjective decreased cognitive at times most likely due to reduced focus.     Consider MRI or ATN test, and HST if worsens.   Currently on 50 mg sertraline.  If mood worsens can increase the dose.  Rtc 1 year or as needed   45-minute office visit with the majority of the time spent face-to-face for history and physical, discussion/counseling and decision-making.  Additional time with cognitive testing/review, record review and documentation.   This visit is part of a comprehensive longitudinal care medical relationship regarding the patients primary diagnosis of weakness and related concerns.    Trachelle Low A.  Vear, MD, [facility] 08/19/2023, 10:20 AM Certified in Neurology, Clinical Neurophysiology, Sleep Medicine and Neuroimaging  [facility] [address], [address] [place], [place] [address] [phone]

## 2023-09-11 DIAGNOSIS — Z125 Encounter for screening for malignant neoplasm of prostate: Secondary | ICD-10-CM | POA: Diagnosis not present

## 2023-09-11 DIAGNOSIS — I10 Essential (primary) hypertension: Secondary | ICD-10-CM | POA: Diagnosis not present

## 2023-09-11 DIAGNOSIS — E118 Type 2 diabetes mellitus with unspecified complications: Secondary | ICD-10-CM | POA: Diagnosis not present

## 2023-09-11 DIAGNOSIS — E785 Hyperlipidemia, unspecified: Secondary | ICD-10-CM | POA: Diagnosis not present

## 2023-09-16 DIAGNOSIS — I2510 Atherosclerotic heart disease of native coronary artery without angina pectoris: Secondary | ICD-10-CM | POA: Diagnosis not present

## 2023-09-16 DIAGNOSIS — K200 Eosinophilic esophagitis: Secondary | ICD-10-CM | POA: Diagnosis not present

## 2023-09-16 DIAGNOSIS — I4892 Unspecified atrial flutter: Secondary | ICD-10-CM | POA: Diagnosis not present

## 2023-09-16 DIAGNOSIS — R253 Fasciculation: Secondary | ICD-10-CM | POA: Diagnosis not present

## 2023-09-16 DIAGNOSIS — Z0000 Encounter for general adult medical examination without abnormal findings: Secondary | ICD-10-CM | POA: Diagnosis not present

## 2023-09-16 DIAGNOSIS — R29898 Other symptoms and signs involving the musculoskeletal system: Secondary | ICD-10-CM | POA: Diagnosis not present

## 2023-09-16 DIAGNOSIS — I10 Essential (primary) hypertension: Secondary | ICD-10-CM | POA: Diagnosis not present

## 2023-09-16 DIAGNOSIS — E785 Hyperlipidemia, unspecified: Secondary | ICD-10-CM | POA: Diagnosis not present

## 2023-09-16 DIAGNOSIS — F411 Generalized anxiety disorder: Secondary | ICD-10-CM | POA: Diagnosis not present

## 2023-09-16 DIAGNOSIS — E1169 Type 2 diabetes mellitus with other specified complication: Secondary | ICD-10-CM | POA: Diagnosis not present

## 2023-09-16 DIAGNOSIS — K219 Gastro-esophageal reflux disease without esophagitis: Secondary | ICD-10-CM | POA: Diagnosis not present

## 2023-09-23 ENCOUNTER — Telehealth: Payer: Self-pay

## 2023-09-23 NOTE — Telephone Encounter (Signed)
   Pre-operative Risk Assessment    Patient Name: James Booth  DOB: [birth year] MRN: [id number]   Date of last office visit: 03/19/23 DORN LESCHES, MD Date of next office visit: NONE   Request for Surgical Clearance    Procedure:  EGD  Date of Surgery:  Clearance TBD                                Surgeon:  NOT INDICATED Surgeon's Group or Practice Name:  [facility] Phone number:  [phone] Fax number:  [phone]   Type of Clearance Requested:   - Medical  - Pharmacy:  Hold Apixaban  (Eliquis )     Type of Anesthesia:  Not Indicated   Additional requests/questions:    SignedLucie DELENA Ku   09/23/2023, 4:43 PM

## 2023-09-24 DIAGNOSIS — R29898 Other symptoms and signs involving the musculoskeletal system: Secondary | ICD-10-CM | POA: Diagnosis not present

## 2023-09-26 ENCOUNTER — Telehealth: Payer: Self-pay

## 2023-09-26 DIAGNOSIS — R29898 Other symptoms and signs involving the musculoskeletal system: Secondary | ICD-10-CM | POA: Diagnosis not present

## 2023-09-26 NOTE — Telephone Encounter (Signed)
 Patient with diagnosis of atrial fibrillation on Eliquis  for anticoagulation.    Procedure:  EGD   Date of Surgery:  Clearance TBD    CHA2DS2-VASc Score = 4   This indicates a 4.8% annual risk of stroke. The patient's score is based upon: CHF History: 0 HTN History: 1 Diabetes History: 1 Stroke History: 0 Vascular Disease History: 0 Age Score: 2 Gender Score: 0    CrCl 66 Platelet count 306  Patient has not had an Afib/aflutter ablation within the last 3 months or DCCV within the last 30 days   Per office protocol, patient can hold Eliquis  for 2 days prior to procedure.   Patient will not need bridging with Lovenox (enoxaparin) around procedure.  **This guidance is not considered finalized until pre-operative APP has relayed final recommendations.**

## 2023-09-26 NOTE — Telephone Encounter (Signed)
 Appointment scheduled for 10/07/23 @ 2:20pm.  Med req and consent.

## 2023-09-26 NOTE — Telephone Encounter (Signed)
   Name: James Booth  DOB: 12/10/1950  MRN: [id number]  Primary Cardiologist: None   Preoperative team, please contact this patient and set up a phone call appointment for further preoperative risk assessment. Please obtain consent and complete medication review. Thank you for your help.  I confirm that guidance regarding antiplatelet and oral anticoagulation therapy has been completed and, if necessary, noted below.  Per office protocol, patient can hold Eliquis  for 2 days prior to procedure.   Patient will not need bridging with Lovenox (enoxaparin) around procedure.  I also confirmed the patient resides in the state of [place] . As per [facility] Medical Board telemedicine laws, the patient must reside in the state in which the provider is licensed.   Wyn Raddle, Jackee Shove, NP 09/26/2023, 8:56 AM [facility]

## 2023-09-26 NOTE — Telephone Encounter (Signed)
  Patient Consent for Virtual Visit         James Booth has provided verbal consent on 09/26/2023 for a virtual visit (video or telephone).  Appointment scheduled for 10/07/23 @ 2:20pm.  Med req and consent are complete.    CONSENT FOR VIRTUAL VISIT FOR:  James Booth  By participating in this virtual visit I agree to the following:  I hereby voluntarily request, consent and authorize [facility] and its employed or contracted physicians, physician assistants, nurse practitioners or other licensed health care professionals (the Practitioner), to provide me with telemedicine health care services (the "Services) as deemed necessary by the treating Practitioner. I acknowledge and consent to receive the Services by the Practitioner via telemedicine. I understand that the telemedicine visit will involve communicating with the Practitioner through live audiovisual communication technology and the disclosure of certain medical information by electronic transmission. I acknowledge that I have been given the opportunity to request an in-person assessment or other available alternative prior to the telemedicine visit and am voluntarily participating in the telemedicine visit.  I understand that I have the right to withhold or withdraw my consent to the use of telemedicine in the course of my care at any time, without affecting my right to future care or treatment, and that the Practitioner or I may terminate the telemedicine visit at any time. I understand that I have the right to inspect all information obtained and/or recorded in the course of the telemedicine visit and may receive copies of available information for a reasonable fee.  I understand that some of the potential risks of receiving the Services via telemedicine include:  Delay or interruption in medical evaluation due to technological equipment failure or disruption; Information transmitted may not be sufficient (e.g. poor resolution  of images) to allow for appropriate medical decision making by the Practitioner; and/or  In rare instances, security protocols could fail, causing a breach of personal health information.  Furthermore, I acknowledge that it is my responsibility to provide information about my medical history, conditions and care that is complete and accurate to the best of my ability. I acknowledge that Practitioner's advice, recommendations, and/or decision may be based on factors not within their control, such as incomplete or inaccurate data provided by me or distortions of diagnostic images or specimens that may result from electronic transmissions. I understand that the practice of medicine is not an exact science and that Practitioner makes no warranties or guarantees regarding treatment outcomes. I acknowledge that a copy of this consent can be made available to me via my patient portal [facility] MyChart), or I can request a printed copy by calling the office of [facility].    I understand that my insurance will be billed for this visit.   I have read or had this consent read to me. I understand the contents of this consent, which adequately explains the benefits and risks of the Services being provided via telemedicine.  I have been provided ample opportunity to ask questions regarding this consent and the Services and have had my questions answered to my satisfaction. I give my informed consent for the services to be provided through the use of telemedicine in my medical care

## 2023-10-01 DIAGNOSIS — R29898 Other symptoms and signs involving the musculoskeletal system: Secondary | ICD-10-CM | POA: Diagnosis not present

## 2023-10-03 DIAGNOSIS — R29898 Other symptoms and signs involving the musculoskeletal system: Secondary | ICD-10-CM | POA: Diagnosis not present

## 2023-10-07 ENCOUNTER — Ambulatory Visit: Attending: Cardiovascular Disease

## 2023-10-07 DIAGNOSIS — Z01810 Encounter for preprocedural cardiovascular examination: Secondary | ICD-10-CM

## 2023-10-07 DIAGNOSIS — R29898 Other symptoms and signs involving the musculoskeletal system: Secondary | ICD-10-CM | POA: Diagnosis not present

## 2023-10-07 NOTE — Progress Notes (Signed)
 Virtual Visit via Telephone Note   Because of JACOBE STUDY co-morbid illnesses, he is at least at moderate risk for complications without adequate follow up.  This format is felt to be most appropriate for this patient at this time.  Due to technical limitations with video connection (technology), today's appointment will be conducted as an audio only telehealth visit, and James Booth verbally agreed to proceed in this manner.   All issues noted in this document were discussed and addressed.  No physical exam could be performed with this format.  Evaluation Performed:  Preoperative cardiovascular risk assessment _____________   Date:  10/07/2023   Patient ID:  James Booth, DOB 10/12/1950, MRN [id number] Patient Location:  Home Provider location:   Office  Primary Care Provider:  Clarice Nottingham, MD Primary Cardiologist:  None  Chief Complaint / Patient Profile   73 y.o. y/o male with a h/o hypertension, atypical atrial flutter, PVCs, hyperlipidemia who is pending EGD and presents today for telephonic preoperative cardiovascular risk assessment.  History of Present Illness    James Booth is a 73 y.o. male who presents via audio/video conferencing for a telehealth visit today.  Pt was last seen in cardiology clinic on 03/19/2023 by Dr. Court.  At that time James Booth was doing well .  The patient is now pending procedure as outlined above. Since his last visit, he continues to be stable from a cardiac standpoint.  Today he denies chest pain, shortness of breath, lower extremity edema, fatigue, palpitations, melena, hematuria, hemoptysis, diaphoresis, weakness, presyncope, syncope, orthopnea, and PND.   Past Medical History    Past Medical History:  Diagnosis Date   Atrial flutter (HCC) 2023   Diabetes mellitus without complication (HCC)    Esophageal stricture    Family history of heart disease    Hypertension    Nephrolithiasis    Prostate cancer (HCC)    Past Surgical  History:  Procedure Laterality Date   CATARACT EXTRACTION Bilateral    PROSTATECTOMY     TONSILLECTOMY     VARICOCELECTOMY      Allergies  Allergies  Allergen Reactions   Aspirin Nausea Only    Acid reflux    Home Medications    Prior to Admission medications   Medication Sig Start Date End Date Taking? Authorizing Provider  acetaminophen (TYLENOL) 650 MG CR tablet Take 650 mg by mouth every 8 (eight) hours as needed for pain.    [provider]  apixaban  (ELIQUIS ) 5 MG TABS tablet TAKE 1 TABLET BY MOUTH TWICE A DAY 05/29/23   Court Dorn PARAS, MD  guaiFENesin (MUCINEX) 600 MG 12 hr tablet 2 tablet as needed Patient not taking: Reported on 08/19/2023 01/16/12   [provider]  metFORMIN (GLUCOPHAGE) 500 MG tablet Take 500 mg by mouth daily. 08/28/19   [provider]  olmesartan (BENICAR) 20 MG tablet Take 20 mg by mouth daily. 05/18/20   [provider]  ONE TOUCH ULTRA TEST test strip  08/04/13   [provider]  OZEMPIC, 1 MG/DOSE, 4 MG/3ML SOPN Inject into the skin. 04/08/20   [provider]  rosuvastatin (CRESTOR) 20 MG tablet Take 20 mg by mouth daily.    [provider]  sertraline (ZOLOFT) 50 MG tablet Take 50 mg by mouth daily.    [provider]    Physical Exam    Vital Signs:  James Booth does not have vital signs available for review today.  Given telephonic nature of communication, physical exam is limited. AAOx3. NAD. Normal affect.  Speech and respirations are unlabored.  Accessory Clinical Findings    None  Assessment & Plan    1.  Preoperative Cardiovascular Risk Assessment: EGD   Date of Surgery:  Clearance TBD                                  Surgeon:  NOT INDICATED Surgeon's Group or Practice Name:  [facility] Phone number:  [phone] Fax number:  [phone]      Primary Cardiologist: None  Chart reviewed as part of pre-operative protocol coverage. Given past medical  history and time since last visit, based on ACC/AHA guidelines, James Booth would be at acceptable risk for the planned procedure without further cardiovascular testing.    Patient was advised that if he develops new symptoms prior to surgery to contact our office to arrange a follow-up appointment.  He verbalized understanding.  Per office protocol, patient can hold Eliquis  for 2 days prior to procedure.   Patient will not need bridging with Lovenox (enoxaparin) around procedure.  I will route this recommendation to the requesting party via Epic fax function and remove from pre-op pool.       Time:   Today, I have spent 5 minutes with the patient with telehealth technology discussing medical history, symptoms, and management plan.  I spent 10 minutes reviewing patient's past cardiac history and cardiac medications.    James CHRISTELLA Beauvais, NP  10/07/2023, 7:58 AM

## 2023-10-08 NOTE — Telephone Encounter (Signed)
 2nd request received. Will route over the preop clearance.

## 2023-10-10 DIAGNOSIS — R29898 Other symptoms and signs involving the musculoskeletal system: Secondary | ICD-10-CM | POA: Diagnosis not present

## 2023-10-15 DIAGNOSIS — R29898 Other symptoms and signs involving the musculoskeletal system: Secondary | ICD-10-CM | POA: Diagnosis not present

## 2023-10-17 DIAGNOSIS — R29898 Other symptoms and signs involving the musculoskeletal system: Secondary | ICD-10-CM | POA: Diagnosis not present

## 2023-10-22 DIAGNOSIS — R29898 Other symptoms and signs involving the musculoskeletal system: Secondary | ICD-10-CM | POA: Diagnosis not present

## 2023-10-24 DIAGNOSIS — R29898 Other symptoms and signs involving the musculoskeletal system: Secondary | ICD-10-CM | POA: Diagnosis not present

## 2023-10-25 DIAGNOSIS — Z23 Encounter for immunization: Secondary | ICD-10-CM | POA: Diagnosis not present

## 2023-10-28 DIAGNOSIS — R29898 Other symptoms and signs involving the musculoskeletal system: Secondary | ICD-10-CM | POA: Diagnosis not present

## 2023-10-30 DIAGNOSIS — R29898 Other symptoms and signs involving the musculoskeletal system: Secondary | ICD-10-CM | POA: Diagnosis not present

## 2023-11-05 DIAGNOSIS — R29898 Other symptoms and signs involving the musculoskeletal system: Secondary | ICD-10-CM | POA: Diagnosis not present

## 2023-11-07 DIAGNOSIS — R29898 Other symptoms and signs involving the musculoskeletal system: Secondary | ICD-10-CM | POA: Diagnosis not present

## 2023-11-12 DIAGNOSIS — R29898 Other symptoms and signs involving the musculoskeletal system: Secondary | ICD-10-CM | POA: Diagnosis not present

## 2023-11-14 DIAGNOSIS — R29898 Other symptoms and signs involving the musculoskeletal system: Secondary | ICD-10-CM | POA: Diagnosis not present

## 2023-11-19 DIAGNOSIS — R29898 Other symptoms and signs involving the musculoskeletal system: Secondary | ICD-10-CM | POA: Diagnosis not present

## 2023-11-21 DIAGNOSIS — R29898 Other symptoms and signs involving the musculoskeletal system: Secondary | ICD-10-CM | POA: Diagnosis not present

## 2023-11-25 DIAGNOSIS — R29898 Other symptoms and signs involving the musculoskeletal system: Secondary | ICD-10-CM | POA: Diagnosis not present

## 2023-11-28 DIAGNOSIS — R29898 Other symptoms and signs involving the musculoskeletal system: Secondary | ICD-10-CM | POA: Diagnosis not present

## 2023-12-02 DIAGNOSIS — R29898 Other symptoms and signs involving the musculoskeletal system: Secondary | ICD-10-CM | POA: Diagnosis not present

## 2023-12-04 DIAGNOSIS — R29898 Other symptoms and signs involving the musculoskeletal system: Secondary | ICD-10-CM | POA: Diagnosis not present

## 2023-12-09 DIAGNOSIS — R29898 Other symptoms and signs involving the musculoskeletal system: Secondary | ICD-10-CM | POA: Diagnosis not present

## 2023-12-17 DIAGNOSIS — R29898 Other symptoms and signs involving the musculoskeletal system: Secondary | ICD-10-CM | POA: Diagnosis not present

## 2023-12-19 DIAGNOSIS — R29898 Other symptoms and signs involving the musculoskeletal system: Secondary | ICD-10-CM | POA: Diagnosis not present

## 2023-12-24 DIAGNOSIS — R29898 Other symptoms and signs involving the musculoskeletal system: Secondary | ICD-10-CM | POA: Diagnosis not present

## 2023-12-25 ENCOUNTER — Other Ambulatory Visit: Payer: Self-pay | Admitting: Cardiovascular Disease

## 2023-12-25 DIAGNOSIS — I483 Typical atrial flutter: Secondary | ICD-10-CM

## 2023-12-25 MED ORDER — APIXABAN 5 MG PO TABS: 5.0000 mg | ORAL_TABLET | Freq: Two times a day (BID) | ORAL | 5 refills | Status: DC

## 2023-12-25 NOTE — Telephone Encounter (Signed)
 Eliquis  5mg  refill request received. Patient is 73 years old, weight-70.1kg, Crea-0.87 on 09/12/23 via Care Everywhere from [facility], Diagnosis-Aflutter, and last seen by Josefa Beauvais on 10/07/23 via TeleVisit. Dose is appropriate based on dosing criteria. Will send in refill to requested pharmacy.

## 2023-12-30 DIAGNOSIS — R29898 Other symptoms and signs involving the musculoskeletal system: Secondary | ICD-10-CM | POA: Diagnosis not present

## 2024-01-02 DIAGNOSIS — R29898 Other symptoms and signs involving the musculoskeletal system: Secondary | ICD-10-CM | POA: Diagnosis not present

## 2024-01-08 DIAGNOSIS — R29898 Other symptoms and signs involving the musculoskeletal system: Secondary | ICD-10-CM | POA: Diagnosis not present

## 2024-01-13 DIAGNOSIS — R29898 Other symptoms and signs involving the musculoskeletal system: Secondary | ICD-10-CM | POA: Diagnosis not present

## 2024-02-06 ENCOUNTER — Other Ambulatory Visit: Payer: Self-pay

## 2024-02-06 DIAGNOSIS — I483 Typical atrial flutter: Secondary | ICD-10-CM

## 2024-02-06 MED ORDER — APIXABAN 5 MG PO TABS: 5.0000 mg | ORAL_TABLET | Freq: Two times a day (BID) | ORAL | 1 refills | Status: AC

## 2024-02-06 NOTE — Telephone Encounter (Signed)
 Prescription refill request for Eliquis  received. Indication: Atrial Flutter Last office visit: 03/19/23 Scr: 0.85 10/19/22 Care Everywhere Age: 74 Weight: 70.1 KG Pt has Passed Parameters. Labs overdue

## 2024-02-06 NOTE — Telephone Encounter (Signed)
 Cr 0.87 in [facility] 09/11/23
